# Patient Record
Sex: Female | Born: 1960 | Race: Black or African American | Hispanic: No | Smoking: Current every day smoker
Health system: Southern US, Community
[De-identification: ages and names within clinical notes are randomized; demographics above are authoritative.]

## PROBLEM LIST (undated history)

## (undated) DIAGNOSIS — N189 Chronic kidney disease, unspecified: Secondary | ICD-10-CM

## (undated) DIAGNOSIS — E7800 Pure hypercholesterolemia, unspecified: Secondary | ICD-10-CM

## (undated) DIAGNOSIS — G4733 Obstructive sleep apnea (adult) (pediatric): Secondary | ICD-10-CM

## (undated) DIAGNOSIS — M329 Systemic lupus erythematosus, unspecified: Secondary | ICD-10-CM

## (undated) DIAGNOSIS — J45909 Unspecified asthma, uncomplicated: Secondary | ICD-10-CM

## (undated) DIAGNOSIS — E119 Type 2 diabetes mellitus without complications: Secondary | ICD-10-CM

## (undated) DIAGNOSIS — M797 Fibromyalgia: Secondary | ICD-10-CM

## (undated) DIAGNOSIS — I10 Essential (primary) hypertension: Secondary | ICD-10-CM

## (undated) DIAGNOSIS — G894 Chronic pain syndrome: Secondary | ICD-10-CM

## (undated) DIAGNOSIS — F609 Personality disorder, unspecified: Secondary | ICD-10-CM

## (undated) DIAGNOSIS — F329 Major depressive disorder, single episode, unspecified: Secondary | ICD-10-CM

## (undated) DIAGNOSIS — H269 Unspecified cataract: Secondary | ICD-10-CM

## (undated) DIAGNOSIS — D649 Anemia, unspecified: Secondary | ICD-10-CM

## (undated) DIAGNOSIS — F32A Depression, unspecified: Secondary | ICD-10-CM

## (undated) HISTORY — PX: KNEE ARTHROSCOPY: SUR90

## (undated) HISTORY — DX: Unspecified cataract: H26.9

## (undated) HISTORY — PX: OOPHORECTOMY: SHX86

## (undated) HISTORY — PX: GALLBLADDER SURGERY: SHX652

## (undated) HISTORY — PX: SPINAL FUSION: SHX223

## (undated) HISTORY — PX: BUNIONECTOMY: SHX129

## (undated) HISTORY — PX: ABDOMINAL HYSTERECTOMY: SHX81

## (undated) HISTORY — PX: CERVICAL FUSION: SHX112

---

## 2016-05-05 ENCOUNTER — Ambulatory Visit
Admission: RE | Admit: 2016-05-05 | Discharge: 2016-05-05 | Disposition: A | Discharge status: Home / Self Care | Payer: Medicaid Other | Source: Ambulatory Visit | Attending: Anesthesiology | Admitting: Anesthesiology

## 2016-05-05 ENCOUNTER — Other Ambulatory Visit: Payer: Self-pay | Admitting: Anesthesiology

## 2016-05-05 DIAGNOSIS — M545 Low back pain: Secondary | ICD-10-CM

## 2016-06-01 ENCOUNTER — Other Ambulatory Visit: Payer: Self-pay | Admitting: Internal Medicine

## 2016-06-01 DIAGNOSIS — Z1231 Encounter for screening mammogram for malignant neoplasm of breast: Secondary | ICD-10-CM

## 2016-06-21 ENCOUNTER — Ambulatory Visit
Admission: RE | Admit: 2016-06-21 | Discharge: 2016-06-21 | Disposition: A | Discharge status: Home / Self Care | Payer: Medicare Other | Source: Ambulatory Visit | Attending: Internal Medicine | Admitting: Internal Medicine

## 2016-06-21 DIAGNOSIS — Z1231 Encounter for screening mammogram for malignant neoplasm of breast: Secondary | ICD-10-CM

## 2016-10-22 ENCOUNTER — Encounter (HOSPITAL_COMMUNITY): Payer: Self-pay

## 2016-10-22 ENCOUNTER — Emergency Department (HOSPITAL_COMMUNITY): Payer: Medicare Other

## 2016-10-22 ENCOUNTER — Inpatient Hospital Stay (HOSPITAL_COMMUNITY)
Admission: EM | Admit: 2016-10-22 | Discharge: 2016-10-24 | DRG: 202 | Disposition: A | Discharge status: Home / Self Care | Payer: Medicare Other | Attending: Internal Medicine | Admitting: Internal Medicine

## 2016-10-22 DIAGNOSIS — M329 Systemic lupus erythematosus, unspecified: Secondary | ICD-10-CM | POA: Diagnosis present

## 2016-10-22 DIAGNOSIS — I110 Hypertensive heart disease with heart failure: Secondary | ICD-10-CM | POA: Diagnosis present

## 2016-10-22 DIAGNOSIS — M797 Fibromyalgia: Secondary | ICD-10-CM | POA: Diagnosis present

## 2016-10-22 DIAGNOSIS — J45901 Unspecified asthma with (acute) exacerbation: Secondary | ICD-10-CM

## 2016-10-22 DIAGNOSIS — Z885 Allergy status to narcotic agent status: Secondary | ICD-10-CM | POA: Diagnosis not present

## 2016-10-22 DIAGNOSIS — E119 Type 2 diabetes mellitus without complications: Secondary | ICD-10-CM | POA: Diagnosis present

## 2016-10-22 DIAGNOSIS — G4733 Obstructive sleep apnea (adult) (pediatric): Secondary | ICD-10-CM | POA: Diagnosis present

## 2016-10-22 DIAGNOSIS — Z881 Allergy status to other antibiotic agents status: Secondary | ICD-10-CM | POA: Diagnosis not present

## 2016-10-22 DIAGNOSIS — F329 Major depressive disorder, single episode, unspecified: Secondary | ICD-10-CM | POA: Diagnosis present

## 2016-10-22 DIAGNOSIS — Z79899 Other long term (current) drug therapy: Secondary | ICD-10-CM | POA: Diagnosis not present

## 2016-10-22 DIAGNOSIS — E7800 Pure hypercholesterolemia, unspecified: Secondary | ICD-10-CM | POA: Diagnosis present

## 2016-10-22 DIAGNOSIS — I10 Essential (primary) hypertension: Secondary | ICD-10-CM

## 2016-10-22 DIAGNOSIS — J209 Acute bronchitis, unspecified: Secondary | ICD-10-CM | POA: Diagnosis present

## 2016-10-22 DIAGNOSIS — Z888 Allergy status to other drugs, medicaments and biological substances status: Secondary | ICD-10-CM | POA: Diagnosis not present

## 2016-10-22 DIAGNOSIS — J4541 Moderate persistent asthma with (acute) exacerbation: Secondary | ICD-10-CM | POA: Diagnosis present

## 2016-10-22 DIAGNOSIS — Z794 Long term (current) use of insulin: Secondary | ICD-10-CM

## 2016-10-22 DIAGNOSIS — J440 Chronic obstructive pulmonary disease with acute lower respiratory infection: Secondary | ICD-10-CM | POA: Diagnosis present

## 2016-10-22 DIAGNOSIS — F609 Personality disorder, unspecified: Secondary | ICD-10-CM | POA: Diagnosis present

## 2016-10-22 DIAGNOSIS — G894 Chronic pain syndrome: Secondary | ICD-10-CM | POA: Diagnosis present

## 2016-10-22 DIAGNOSIS — I509 Heart failure, unspecified: Secondary | ICD-10-CM | POA: Diagnosis present

## 2016-10-22 DIAGNOSIS — R0600 Dyspnea, unspecified: Secondary | ICD-10-CM | POA: Diagnosis not present

## 2016-10-22 DIAGNOSIS — J441 Chronic obstructive pulmonary disease with (acute) exacerbation: Secondary | ICD-10-CM

## 2016-10-22 HISTORY — DX: Unspecified asthma, uncomplicated: J45.909

## 2016-10-22 HISTORY — DX: Chronic kidney disease, unspecified: N18.9

## 2016-10-22 HISTORY — DX: Type 2 diabetes mellitus without complications: E11.9

## 2016-10-22 HISTORY — DX: Essential (primary) hypertension: I10

## 2016-10-22 HISTORY — DX: Systemic lupus erythematosus, unspecified: M32.9

## 2016-10-22 HISTORY — DX: Chronic pain syndrome: G89.4

## 2016-10-22 HISTORY — DX: Pure hypercholesterolemia, unspecified: E78.00

## 2016-10-22 HISTORY — DX: Fibromyalgia: M79.7

## 2016-10-22 HISTORY — DX: Personality disorder, unspecified: F60.9

## 2016-10-22 HISTORY — DX: Obstructive sleep apnea (adult) (pediatric): G47.33

## 2016-10-22 HISTORY — DX: Depression, unspecified: F32.A

## 2016-10-22 HISTORY — DX: Major depressive disorder, single episode, unspecified: F32.9

## 2016-10-22 HISTORY — DX: Anemia, unspecified: D64.9

## 2016-10-22 LAB — BASIC METABOLIC PANEL
Anion gap: 7 (ref 5–15)
BUN: 17 mg/dL (ref 6–20)
CHLORIDE: 104 mmol/L (ref 101–111)
CO2: 26 mmol/L (ref 22–32)
Calcium: 9.3 mg/dL (ref 8.9–10.3)
Creatinine, Ser: 1.14 mg/dL — ABNORMAL HIGH (ref 0.44–1.00)
GFR calc non Af Amer: 52 mL/min — ABNORMAL LOW (ref >60)
Glucose, Bld: 153 mg/dL — ABNORMAL HIGH (ref 65–99)
POTASSIUM: 4.6 mmol/L (ref 3.5–5.1)
Sodium: 137 mmol/L (ref 135–145)

## 2016-10-22 LAB — INFLUENZA PANEL BY PCR (TYPE A & B)
Influenza A By PCR: NEGATIVE
Influenza B By PCR: NEGATIVE

## 2016-10-22 LAB — BRAIN NATRIURETIC PEPTIDE
B Natriuretic Peptide: 253.6 pg/mL — ABNORMAL HIGH (ref 0.0–100.0)
B Natriuretic Peptide: 193 pg/mL — ABNORMAL HIGH (ref 0.0–100.0)

## 2016-10-22 LAB — STREP PNEUMONIAE URINARY ANTIGEN: STREP PNEUMO URINARY ANTIGEN: NEGATIVE

## 2016-10-22 LAB — CBC WITH DIFFERENTIAL/PLATELET
BASOS ABS: 0 10*3/uL (ref 0.0–0.1)
Basophils Relative: 0 %
Eosinophils Absolute: 0.1 10*3/uL (ref 0.0–0.7)
Eosinophils Relative: 1 %
HEMATOCRIT: 32.9 % — AB (ref 36.0–46.0)
Hemoglobin: 10.8 g/dL — ABNORMAL LOW (ref 12.0–15.0)
LYMPHS ABS: 1.8 10*3/uL (ref 0.7–4.0)
LYMPHS PCT: 14 %
MCH: 29.8 pg (ref 26.0–34.0)
MCHC: 32.8 g/dL (ref 30.0–36.0)
MCV: 90.9 fL (ref 78.0–100.0)
Monocytes Absolute: 0.6 10*3/uL (ref 0.1–1.0)
Monocytes Relative: 5 %
NEUTROS ABS: 10.3 10*3/uL — AB (ref 1.7–7.7)
Neutrophils Relative %: 80 %
Platelets: 271 10*3/uL (ref 150–400)
RBC: 3.62 MIL/uL — AB (ref 3.87–5.11)
RDW: 14.7 % (ref 11.5–15.5)
WBC: 12.8 10*3/uL — ABNORMAL HIGH (ref 4.0–10.5)

## 2016-10-22 LAB — GLUCOSE, CAPILLARY
GLUCOSE-CAPILLARY: 223 mg/dL — AB (ref 65–99)
GLUCOSE-CAPILLARY: 193 mg/dL — AB (ref 65–99)

## 2016-10-22 LAB — URINALYSIS, ROUTINE W REFLEX MICROSCOPIC
Bilirubin Urine: NEGATIVE
GLUCOSE, UA: NEGATIVE mg/dL
HGB URINE DIPSTICK: NEGATIVE
Ketones, ur: NEGATIVE mg/dL
LEUKOCYTES UA: NEGATIVE
Nitrite: NEGATIVE
PH: 5 (ref 5.0–8.0)
PROTEIN: NEGATIVE mg/dL
SPECIFIC GRAVITY, URINE: 1.01 (ref 1.005–1.030)

## 2016-10-22 LAB — I-STAT TROPONIN, ED: Troponin i, poc: 0.01 ng/mL (ref 0.00–0.08)

## 2016-10-22 LAB — PHOSPHORUS: PHOSPHORUS: 3.6 mg/dL (ref 2.5–4.6)

## 2016-10-22 LAB — TROPONIN I: Troponin I: 0.03 ng/mL (ref <0.03)

## 2016-10-22 LAB — MAGNESIUM: MAGNESIUM: 1.7 mg/dL (ref 1.7–2.4)

## 2016-10-22 LAB — TSH: TSH: 0.65 u[IU]/mL (ref 0.350–4.500)

## 2016-10-22 MED ORDER — IPRATROPIUM-ALBUTEROL 0.5-2.5 (3) MG/3ML IN SOLN
3.0000 mL | RESPIRATORY_TRACT | Status: AC
  Administered 2016-10-22 (×3): 3 mL via RESPIRATORY_TRACT
  Filled 2016-10-22: qty 9

## 2016-10-22 MED ORDER — ALBUTEROL (5 MG/ML) CONTINUOUS INHALATION SOLN
5.0000 mg/h | INHALATION_SOLUTION | RESPIRATORY_TRACT | Status: DC
  Administered 2016-10-22: 5 mg/h via RESPIRATORY_TRACT
  Filled 2016-10-22: qty 20

## 2016-10-22 MED ORDER — SERTRALINE HCL 100 MG PO TABS
200.0000 mg | ORAL_TABLET | Freq: Every day | ORAL | Status: DC
  Administered 2016-10-22 – 2016-10-24 (×3): 200 mg via ORAL
  Filled 2016-10-22 (×3): qty 2

## 2016-10-22 MED ORDER — FUROSEMIDE 40 MG PO TABS
80.0000 mg | ORAL_TABLET | Freq: Every day | ORAL | Status: DC
  Administered 2016-10-23 – 2016-10-24 (×2): 80 mg via ORAL
  Filled 2016-10-22 (×2): qty 2

## 2016-10-22 MED ORDER — ONDANSETRON HCL 4 MG PO TABS
4.0000 mg | ORAL_TABLET | Freq: Three times a day (TID) | ORAL | Status: DC | PRN
  Administered 2016-10-23: 4 mg via ORAL
  Filled 2016-10-22: qty 1

## 2016-10-22 MED ORDER — PREDNISONE 20 MG PO TABS
40.0000 mg | ORAL_TABLET | Freq: Once | ORAL | Status: AC
  Administered 2016-10-22: 40 mg via ORAL
  Filled 2016-10-22: qty 2

## 2016-10-22 MED ORDER — SALINE SPRAY 0.65 % NA SOLN
2.0000 | Freq: Two times a day (BID) | NASAL | Status: DC
  Administered 2016-10-22 – 2016-10-24 (×4): 2 via NASAL
  Filled 2016-10-22: qty 44

## 2016-10-22 MED ORDER — SODIUM CHLORIDE 0.45 % IV SOLN
INTRAVENOUS | Status: DC
  Administered 2016-10-22: 22:00:00 via INTRAVENOUS

## 2016-10-22 MED ORDER — ALBUTEROL SULFATE (2.5 MG/3ML) 0.083% IN NEBU
2.5000 mg | INHALATION_SOLUTION | RESPIRATORY_TRACT | Status: DC | PRN
  Administered 2016-10-23: 2.5 mg via RESPIRATORY_TRACT
  Filled 2016-10-22: qty 3

## 2016-10-22 MED ORDER — BUSPIRONE HCL 5 MG PO TABS
15.0000 mg | ORAL_TABLET | Freq: Three times a day (TID) | ORAL | Status: DC
  Administered 2016-10-22 – 2016-10-24 (×5): 15 mg via ORAL
  Filled 2016-10-22 (×5): qty 3

## 2016-10-22 MED ORDER — ARIPIPRAZOLE 10 MG PO TABS
20.0000 mg | ORAL_TABLET | Freq: Every day | ORAL | Status: DC
  Administered 2016-10-22 – 2016-10-24 (×3): 20 mg via ORAL
  Filled 2016-10-22 (×3): qty 2

## 2016-10-22 MED ORDER — SIMVASTATIN 40 MG PO TABS
40.0000 mg | ORAL_TABLET | Freq: Every day | ORAL | Status: DC
  Administered 2016-10-22 – 2016-10-24 (×3): 40 mg via ORAL
  Filled 2016-10-22 (×3): qty 1

## 2016-10-22 MED ORDER — TAB-A-VITE/IRON PO TABS
1.0000 | ORAL_TABLET | Freq: Every day | ORAL | Status: DC
  Administered 2016-10-22 – 2016-10-24 (×3): 1 via ORAL
  Filled 2016-10-22 (×3): qty 1

## 2016-10-22 MED ORDER — PANTOPRAZOLE SODIUM 40 MG PO TBEC
40.0000 mg | DELAYED_RELEASE_TABLET | Freq: Every day | ORAL | Status: DC
  Administered 2016-10-22 – 2016-10-24 (×3): 40 mg via ORAL
  Filled 2016-10-22 (×3): qty 1

## 2016-10-22 MED ORDER — SODIUM CHLORIDE 0.9 % IV SOLN: 250.0000 mL | INTRAVENOUS | Status: DC | PRN

## 2016-10-22 MED ORDER — METHYLPREDNISOLONE SODIUM SUCC 125 MG IJ SOLR
125.0000 mg | Freq: Four times a day (QID) | INTRAMUSCULAR | Status: DC
  Administered 2016-10-22 – 2016-10-23 (×2): 125 mg via INTRAVENOUS
  Filled 2016-10-22 (×2): qty 2

## 2016-10-22 MED ORDER — MORPHINE-NALTREXONE 50-2 MG PO CPCR: 1.0000 | ORAL_CAPSULE | Freq: Every day | ORAL | Status: DC

## 2016-10-22 MED ORDER — SODIUM CHLORIDE 0.9% FLUSH: 3.0000 mL | INTRAVENOUS | Status: DC | PRN

## 2016-10-22 MED ORDER — INSULIN ASPART 100 UNIT/ML ~~LOC~~ SOLN
0.0000 [IU] | Freq: Three times a day (TID) | SUBCUTANEOUS | Status: DC
  Administered 2016-10-23: 8 [IU] via SUBCUTANEOUS
  Administered 2016-10-23 – 2016-10-24 (×3): 3 [IU] via SUBCUTANEOUS
  Administered 2016-10-24: 8 [IU] via SUBCUTANEOUS

## 2016-10-22 MED ORDER — MONTELUKAST SODIUM 10 MG PO TABS
10.0000 mg | ORAL_TABLET | Freq: Every day | ORAL | Status: DC
  Administered 2016-10-22 – 2016-10-24 (×3): 10 mg via ORAL
  Filled 2016-10-22 (×3): qty 1

## 2016-10-22 MED ORDER — INSULIN ASPART 100 UNIT/ML ~~LOC~~ SOLN
4.0000 [IU] | Freq: Three times a day (TID) | SUBCUTANEOUS | Status: DC
  Administered 2016-10-23 – 2016-10-24 (×5): 4 [IU] via SUBCUTANEOUS

## 2016-10-22 MED ORDER — LEVOFLOXACIN IN D5W 750 MG/150ML IV SOLN
750.0000 mg | INTRAVENOUS | Status: DC
  Administered 2016-10-22: 750 mg via INTRAVENOUS
  Filled 2016-10-22: qty 150

## 2016-10-22 MED ORDER — METFORMIN HCL 500 MG PO TABS: 500.0000 mg | ORAL_TABLET | Freq: Three times a day (TID) | ORAL | Status: DC

## 2016-10-22 MED ORDER — MIRTAZAPINE 15 MG PO TABS
45.0000 mg | ORAL_TABLET | Freq: Every day | ORAL | Status: DC
  Administered 2016-10-22 – 2016-10-23 (×2): 45 mg via ORAL
  Filled 2016-10-22 (×2): qty 3

## 2016-10-22 MED ORDER — INSULIN ASPART 100 UNIT/ML ~~LOC~~ SOLN
0.0000 [IU] | Freq: Every day | SUBCUTANEOUS | Status: DC
  Administered 2016-10-23: 2 [IU] via SUBCUTANEOUS

## 2016-10-22 MED ORDER — MORPHINE-NALTREXONE 50-2 MG PO CPCR
1.0000 | ORAL_CAPSULE | Freq: Every day | ORAL | Status: DC
  Administered 2016-10-22 – 2016-10-24 (×3): 1 via ORAL

## 2016-10-22 MED ORDER — BISACODYL 10 MG RE SUPP: 10.0000 mg | Freq: Every day | RECTAL | Status: DC | PRN

## 2016-10-22 MED ORDER — HEPARIN SODIUM (PORCINE) 5000 UNIT/ML IJ SOLN
5000.0000 [IU] | Freq: Three times a day (TID) | INTRAMUSCULAR | Status: DC
  Administered 2016-10-22 – 2016-10-24 (×5): 5000 [IU] via SUBCUTANEOUS
  Filled 2016-10-22 (×5): qty 1

## 2016-10-22 MED ORDER — SODIUM CHLORIDE 0.9% FLUSH
3.0000 mL | Freq: Two times a day (BID) | INTRAVENOUS | Status: DC
  Administered 2016-10-22 – 2016-10-24 (×4): 3 mL via INTRAVENOUS

## 2016-10-22 MED ORDER — ALBUTEROL SULFATE HFA 108 (90 BASE) MCG/ACT IN AERS: 2.0000 | INHALATION_SPRAY | RESPIRATORY_TRACT | Status: DC | PRN

## 2016-10-22 MED ORDER — GABAPENTIN 800 MG PO TABS
800.0000 mg | ORAL_TABLET | Freq: Four times a day (QID) | ORAL | Status: DC
  Filled 2016-10-22: qty 1

## 2016-10-22 MED ORDER — LABETALOL HCL 200 MG PO TABS
300.0000 mg | ORAL_TABLET | Freq: Two times a day (BID) | ORAL | Status: DC
  Administered 2016-10-22 – 2016-10-24 (×4): 300 mg via ORAL
  Filled 2016-10-22 (×4): qty 1

## 2016-10-22 MED ORDER — IRBESARTAN 75 MG PO TABS
37.5000 mg | ORAL_TABLET | Freq: Every day | ORAL | Status: DC
  Administered 2016-10-22 – 2016-10-24 (×2): 37.5 mg via ORAL
  Filled 2016-10-22 (×2): qty 1

## 2016-10-22 MED ORDER — GABAPENTIN 400 MG PO CAPS
800.0000 mg | ORAL_CAPSULE | Freq: Four times a day (QID) | ORAL | Status: DC
  Administered 2016-10-22 – 2016-10-24 (×7): 800 mg via ORAL
  Filled 2016-10-22 (×7): qty 2

## 2016-10-22 NOTE — ED Provider Notes (Signed)
[facility]-EMERGENCY DEPT Provider Note   CSN: [id number] Arrival date & time: 10/22/16  0920     History   Chief Complaint Chief Complaint  Patient presents with  . URI  . Wheezing    HPI Lauren Jenkins is a 56 y.o. female.  The history is provided by the patient.  Shortness of Breath  This is a recurrent problem. The average episode lasts 1 week. The problem has been gradually worsening. Associated symptoms include coryza, cough, sputum production and chest pain. Pertinent negatives include no fever and no leg swelling. The problem's precipitants include weather/humidity and smoke. Risk factors include smoking. She has tried beta-agonist inhalers for the symptoms. The treatment provided mild relief. Associated medical issues include asthma. Associated medical issues do not include PE, past MI or DVT.  Chest Pain   This is a recurrent problem. Episode onset: 1 week. Episode frequency: intermittent initially; constant for 2-3 days. The problem has not changed since onset.The pain is present in the substernal region. The pain is mild. Quality: tightness; "can't get air in" The pain does not radiate. Associated symptoms include cough, shortness of breath and sputum production. Pertinent negatives include no fever. Risk factors include smoking/tobacco exposure and sedentary lifestyle.  Her past medical history is significant for diabetes, hyperlipidemia and hypertension.  Pertinent negatives for past medical history include no DVT, no MI and no PE.  Procedure history is positive for cardiac catheterization (3-4  yrs ago; no stents placed ) and stress thallium (2016; yrs after the cath).   Endorses h/o tobacco smoking for 15 yrs.    Past Medical History:  Diagnosis Date  . Asthma   . Asthma   . Chronic anemia   . Chronic pain syndrome 10/22/2016  . CKD (chronic kidney disease)   . Depression   . DM2 (diabetes mellitus, type 2) (HCC)   . Fibromyalgia 10/22/2016  . Hypercholesterolemia    . Hypertension   . Hypertension   . Lupus   . Obstructive sleep apnea   . Personality disorder     Patient Active Problem List   Diagnosis Date Noted  . Obstructive sleep apnea 10/22/2016  . DM2 (diabetes mellitus, type 2) (HCC) 10/22/2016  . Hypertension 10/22/2016  . Acute asthma exacerbation 10/22/2016  . Acute bronchitis 10/22/2016    History reviewed. No pertinent surgical history.  OB History    No data available       Home Medications    Prior to Admission medications   Medication Sig Start Date End Date Taking? Authorizing Provider  albuterol (PROVENTIL HFA;VENTOLIN HFA) 108 (90 Base) MCG/ACT inhaler Inhale 2 puffs into the lungs every 4 (four) hours as needed for wheezing or shortness of breath.   Yes Historical Provider, MD  ARIPiprazole (ABILIFY) 20 MG tablet Take 20 mg by mouth daily.   Yes Historical Provider, MD  baclofen (LIORESAL) 10 MG tablet Take 10 mg by mouth 2 (two) times daily as needed for muscle spasms.   Yes Historical Provider, MD  busPIRone (BUSPAR) 15 MG tablet Take 15 mg by mouth 3 (three) times daily.   Yes Historical Provider, MD  EMBEDA 50-2 MG CPCR Take 1 capsule by mouth daily.  10/06/16  Yes Historical Provider, MD  furosemide (LASIX) 40 MG tablet Take 40-80 mg by mouth daily.   Yes Historical Provider, MD  gabapentin (NEURONTIN) 800 MG tablet Take 800 mg by mouth 4 (four) times daily.   Yes Historical Provider, MD  insulin aspart (NOVOLOG FLEXPEN) 100 UNIT/ML FlexPen  Inject 15 Units into the skin 3 (three) times daily with meals.   Yes Historical Provider, MD  labetalol (NORMODYNE) 300 MG tablet Take 300 mg by mouth 2 (two) times daily.   Yes Historical Provider, MD  metFORMIN (GLUCOPHAGE) 500 MG tablet Take 500 mg by mouth 3 (three) times daily.   Yes Historical Provider, MD  methocarbamol (ROBAXIN) 500 MG tablet Take 500 mg by mouth 3 (three) times daily.  10/02/16  Yes Historical Provider, MD  mirtazapine (REMERON) 45 MG tablet Take 45 mg  by mouth at bedtime.   Yes Historical Provider, MD  montelukast (SINGULAIR) 10 MG tablet Take 10 mg by mouth daily.   Yes Historical Provider, MD  Multiple Vitamins-Minerals (CENTRAVITES 50 PLUS PO) Take 1 tablet by mouth daily.   Yes Historical Provider, MD  ondansetron (ZOFRAN) 4 MG tablet Take 4 mg by mouth every 8 (eight) hours as needed for nausea or vomiting.   Yes Historical Provider, MD  OXAYDO 5 MG TABA take 2 tablets by mouth four times a day as needed for pain 10/02/16  Yes Historical Provider, MD  pantoprazole (PROTONIX) 40 MG tablet Take 40 mg by mouth daily.   Yes Historical Provider, MD  sertraline (ZOLOFT) 100 MG tablet Take 200 mg by mouth daily.   Yes Historical Provider, MD  simvastatin (ZOCOR) 40 MG tablet Take 40 mg by mouth daily.   Yes Historical Provider, MD  sodium chloride (OCEAN) 0.65 % SOLN nasal spray Place 2 sprays into both nostrils 2 (two) times daily.   Yes Historical Provider, MD  telmisartan (MICARDIS) 40 MG tablet Take 80 mg by mouth daily.   Yes Historical Provider, MD    Family History History reviewed. No pertinent family history.  Social History Social History  Substance Use Topics  . Smoking status: Never Smoker  . Smokeless tobacco: Never Used  . Alcohol use No     Allergies   Codeine sulfate; Ancef [cefazolin]; Apap [acetaminophen]; Elavil [amitriptyline]; Haldol [haloperidol]; Lyrica [pregabalin]; Prednisone; Procardia [nifedipine]; Rocephin [ceftriaxone]; Clindamycin/lincomycin; Lipitor [atorvastatin]; Metoprolol tartrate; and Zithromax [azithromycin]   Review of Systems Review of Systems  Constitutional: Negative for fever.  Respiratory: Positive for cough, sputum production and shortness of breath.   Cardiovascular: Positive for chest pain. Negative for leg swelling.  Ten systems are reviewed and are negative for acute change except as noted in the HPI    Physical Exam Updated Vital Signs BP 133/76 (BP Location: Left Arm)   Pulse  91   Temp 99 F (37.2 C) (Oral)   Resp 19   Ht 5' 0.25" (1.53 m)   Wt 249 lb (112.9 kg)   SpO2 100%   BMI 48.23 kg/m   Physical Exam  Constitutional: She is oriented to person, place, and time. She appears well-developed and well-nourished. No distress.  HENT:  Head: Normocephalic and atraumatic.  Nose: Nose normal.  Eyes: Conjunctivae and EOM are normal. Pupils are equal, round, and reactive to light. Right eye exhibits no discharge. Left eye exhibits no discharge. No scleral icterus.  Neck: Normal range of motion. Neck supple.  Cardiovascular: Normal rate and regular rhythm.  Exam reveals no gallop and no friction rub.   No murmur heard. Pulmonary/Chest: Effort normal. No stridor. No respiratory distress. She has wheezes.  Poor air flow throughout.   Abdominal: Soft. She exhibits no distension. There is no tenderness.  Musculoskeletal: She exhibits no edema or tenderness.  Neurological: She is alert and oriented to person, place, and time.  Skin: Skin is warm and dry. No rash noted. She is not diaphoretic. No erythema.  Psychiatric: She has a normal mood and affect.  Vitals reviewed.    ED Treatments / Results  Labs (all labs ordered are listed, but only abnormal results are displayed) Labs Reviewed  CBC WITH DIFFERENTIAL/PLATELET - Abnormal; Notable for the following:       Result Value   WBC 12.8 (*)    RBC 3.62 (*)    Hemoglobin 10.8 (*)    HCT 32.9 (*)    Neutro Abs 10.3 (*)    All other components within normal limits  BRAIN NATRIURETIC PEPTIDE - Abnormal; Notable for the following:    B Natriuretic Peptide 253.6 (*)    All other components within normal limits  BASIC METABOLIC PANEL - Abnormal; Notable for the following:    Glucose, Bld 153 (*)    Creatinine, Ser 1.14 (*)    GFR calc non Af Amer 52 (*)    All other components within normal limits  I-STAT TROPOININ, ED    EKG  EKG Interpretation None       Radiology Dg Chest 2 View  Result Date:  10/22/2016 CLINICAL DATA:  Cough, chest pain. EXAM: CHEST  2 VIEW COMPARISON:  None. FINDINGS: Mild cardiomegaly is noted with central pulmonary vascular congestion. No pneumothorax or pleural effusion is noted. No consolidative process is noted in the lungs. Bony thorax is unremarkable. IMPRESSION: Mild cardiomegaly with central pulmonary vascular congestion. Electronically Signed   By: Lupita RaiderJames  Green Jr, M.D.   On: 10/22/2016 10:33    Procedures Procedures (including critical care time)  Medications Ordered in ED Medications  albuterol (PROVENTIL,VENTOLIN) solution continuous neb (5 mg/hr Nebulization New Bag/Given 10/22/16 1330)  albuterol (PROVENTIL) (2.5 MG/3ML) 0.083% nebulizer solution 2.5 mg (not administered)  methylPREDNISolone sodium succinate (SOLU-MEDROL) 125 mg/2 mL injection 125 mg (not administered)  ipratropium-albuterol (DUONEB) 0.5-2.5 (3) MG/3ML nebulizer solution 3 mL (3 mLs Nebulization Given 10/22/16 1140)  predniSONE (DELTASONE) tablet 40 mg (40 mg Oral Given 10/22/16 1116)     Initial Impression / Assessment and Plan / ED Course  I have reviewed the triage vital signs and the nursing notes.  Pertinent labs & imaging results that were available during my care of the patient were reviewed by me and considered in my medical decision making (see chart for details).     COPD/asthma exacerbation. Chest x-ray without evidence of pneumonia. Improvement after DuoNeb's however she should symptoms recurred requiring continuous albuterol nebulizer. Low suspicion for pulmonary embolism. No evidence of volume overload to suggest CHF exacerbation. Admitted to hospitalist for continued management.  Final Clinical Impressions(s) / ED Diagnoses   Final diagnoses:  COPD exacerbation (HCC)  Moderate persistent asthma with exacerbation      Nira ConnPedro Eduardo Raeghan Demeter, MD 10/22/16 1712

## 2016-10-22 NOTE — ED Notes (Signed)
Bed: [id number] Expected date: 10/22/16 Expected time: 9:00 AM Means of arrival: Ambulance Comments: Wheezing, SHOB, breathing tx in progress

## 2016-10-22 NOTE — ED Notes (Signed)
I find her to have audible wheezes; and a bit incresaing shortness of breath as compared with my last assessment. Dr. Eudelia Bunchardama notified--orders rec'd.

## 2016-10-22 NOTE — H&P (Signed)
History and Physical    Lauren Jenkins WUJ:[id number] DOB: July 30, 1961 DOA: 10/22/2016  Referring MD/NP/PA: EDP PCP: No primary care provider on file. Trula Slade, MD Outpatient Specialists:  Patient coming from: Home  Chief Complaint: SOB  HPI: Lauren Jenkins is a 56 y.o. female with medical history significant for and not limited to Hypertension,  OSA on CPAP, and Bronchial Asthma, not steroid dependent ( saids she tends to get "psychotic after being on prednisone for long time, but has been ok with IV steroids previoulsy), s/p endotracheal intubation and mechanical ventilation once several years ago..  She presents with few days history of progressively worsening cough, and chest tightness with coughing as well as worsening shortness of breath  She notes that her symptoms started 2 weeks ago with sorethroat, clear rhinorrhea 2 weeks ago which progressed to with yellowish green-sputum associated with low grade fever and chills, unresponsive to robitussin. She had made appointment to see her pcp, Dr Nehemiah Settle tomorrow but she could not wait due to worsening cough with chest tightness, and difficulty completing simple sentences today.   ED Course: At the ED patient attempt of 48F with a pulse of 91 Minute and she had bilateral wheezes. She was treated with nebulizations and improved however she subsequently worsened again with wheezing and difficulty breathing. Labs indicated mild leukocytosis of 12.8 K.Marland Kitchen EKG was negative for acute ischemic changes and x-ray of the chest was negative for any acute airspace disease.  She is admitted for further treatment of acute bronchial asthma exacerbation secondary to acute bronchitis  Review of Systems: As per HPI otherwise 10 point review of systems negative.   Past Medical History:  Diagnosis Date  . Asthma   . Asthma   . Chronic anemia   . Chronic pain syndrome 10/22/2016  . CKD (chronic kidney disease)   . Depression   . DM2 (diabetes mellitus,  type 2) (HCC)   . Fibromyalgia 10/22/2016  . Hypercholesterolemia   . Hypertension   . Hypertension   . Lupus   . Obstructive sleep apnea   . Personality disorder     History reviewed. No pertinent surgical history.   reports that she has never smoked. She has never used smokeless tobacco. She reports that she does not drink alcohol. Her drug history is not on file.  Allergies  Allergen Reactions  . Codeine Sulfate Shortness Of Breath  . Ancef [Cefazolin] Other (See Comments)    Intolerance   . Apap [Acetaminophen] Other (See Comments)    Intolerance    . Elavil [Amitriptyline] Other (See Comments)    Hallucinations  . Haldol [Haloperidol] Other (See Comments)    Intolerance   . Lyrica [Pregabalin] Other (See Comments)    agitation   . Prednisone Other (See Comments)    Agitation   . Procardia [Nifedipine] Other (See Comments)    insensitivity  . Rocephin [Ceftriaxone] Swelling    angioedema and facial swelling  . Clindamycin/Lincomycin Rash  . Lipitor [Atorvastatin] Rash  . Metoprolol Tartrate Rash  . Zithromax [Azithromycin] Rash    History reviewed. No pertinent family history. Denies known family history of heart disease Prior to Admission medications   Medication Sig Start Date End Date Taking? Authorizing Provider  albuterol (PROVENTIL HFA;VENTOLIN HFA) 108 (90 Base) MCG/ACT inhaler Inhale 2 puffs into the lungs every 4 (four) hours as needed for wheezing or shortness of breath.   Yes Historical Provider, MD  ARIPiprazole (ABILIFY) 20 MG tablet Take 20 mg by mouth daily.  Yes Historical Provider, MD  baclofen (LIORESAL) 10 MG tablet Take 10 mg by mouth 2 (two) times daily as needed for muscle spasms.   Yes Historical Provider, MD  busPIRone (BUSPAR) 15 MG tablet Take 15 mg by mouth 3 (three) times daily.   Yes Historical Provider, MD  EMBEDA 50-2 MG CPCR Take 1 capsule by mouth daily.  10/06/16  Yes Historical Provider, MD  furosemide (LASIX) 40 MG tablet Take  40-80 mg by mouth daily.   Yes Historical Provider, MD  gabapentin (NEURONTIN) 800 MG tablet Take 800 mg by mouth 4 (four) times daily.   Yes Historical Provider, MD  insulin aspart (NOVOLOG FLEXPEN) 100 UNIT/ML FlexPen Inject 15 Units into the skin 3 (three) times daily with meals.   Yes Historical Provider, MD  labetalol (NORMODYNE) 300 MG tablet Take 300 mg by mouth 2 (two) times daily.   Yes Historical Provider, MD  metFORMIN (GLUCOPHAGE) 500 MG tablet Take 500 mg by mouth 3 (three) times daily.   Yes Historical Provider, MD  methocarbamol (ROBAXIN) 500 MG tablet Take 500 mg by mouth 3 (three) times daily.  10/02/16  Yes Historical Provider, MD  mirtazapine (REMERON) 45 MG tablet Take 45 mg by mouth at bedtime.   Yes Historical Provider, MD  montelukast (SINGULAIR) 10 MG tablet Take 10 mg by mouth daily.   Yes Historical Provider, MD  Multiple Vitamins-Minerals (CENTRAVITES 50 PLUS PO) Take 1 tablet by mouth daily.   Yes Historical Provider, MD  ondansetron (ZOFRAN) 4 MG tablet Take 4 mg by mouth every 8 (eight) hours as needed for nausea or vomiting.   Yes Historical Provider, MD  OXAYDO 5 MG TABA take 2 tablets by mouth four times a day as needed for pain 10/02/16  Yes Historical Provider, MD  pantoprazole (PROTONIX) 40 MG tablet Take 40 mg by mouth daily.   Yes Historical Provider, MD  sertraline (ZOLOFT) 100 MG tablet Take 200 mg by mouth daily.   Yes Historical Provider, MD  simvastatin (ZOCOR) 40 MG tablet Take 40 mg by mouth daily.   Yes Historical Provider, MD  sodium chloride (OCEAN) 0.65 % SOLN nasal spray Place 2 sprays into both nostrils 2 (two) times daily.   Yes Historical Provider, MD  telmisartan (MICARDIS) 40 MG tablet Take 80 mg by mouth daily.   Yes Historical Provider, MD    Physical Exam: Vitals:   10/22/16 1058 10/22/16 1100 10/22/16 1109 10/22/16 1258  BP:    142/74  Pulse:  91  99  Resp:  16  21  Temp:      TempSrc:      SpO2: 95% 96% 97% 95%  Weight:        Height:          Constitutional: NAD, calm, comfortable Vitals:   10/22/16 1058 10/22/16 1100 10/22/16 1109 10/22/16 1258  BP:    142/74  Pulse:  91  99  Resp:  16  21  Temp:      TempSrc:      SpO2: 95% 96% 97% 95%  Weight:      Height:       Eyes: PERRL, mild scleral pallor(+) ENMT: mildly dry mucous membranes. Posterior pharynx clear of any exudate or lesions.Normal dentition.  Neck: normal, supple, no masses, no thyromegaly Respiratory: Bilateral diffuse wheezing. Normal respiratory effort. No accessory muscle use.  Cardiovascular: Regular rate and rhythm, no murmurs / rubs / gallops. No extremity edema. 2+ pedal pulses. No carotid bruits.  Abdomen:  no tenderness, no masses palpated. No hepatosplenomegaly. Bowel sounds positive.  Musculoskeletal: no clubbing / cyanosis. No joint deformity upper and lower extremities. Good ROM, no contractures. Normal muscle tone.  Skin: no rashes, lesions, ulcers. No induration Neurologic: CN 2-12 grossly intact. Sensation intact, DTR normal. Strength 5/5 in all 4.  Psychiatric: Normal judgment and insight. Alert and oriented x 3. Normal mood.     Labs on Admission: I have personally reviewed following labs and imaging studies  CBC:  Recent Labs Lab 10/22/16 1102  WBC 12.8*  NEUTROABS 10.3*  HGB 10.8*  HCT 32.9*  MCV 90.9  PLT 271   Basic Metabolic Panel:  Recent Labs Lab 10/22/16 1102  NA 137  K 4.6  CL 104  CO2 26  GLUCOSE 153*  BUN 17  CREATININE 1.14*  CALCIUM 9.3   GFR: Estimated Creatinine Clearance: 62.6 mL/min (by C-G formula based on SCr of 1.14 mg/dL (H)). Liver Function Tests: No results for input(s): AST, ALT, ALKPHOS, BILITOT, PROT, ALBUMIN in the last 168 hours. No results for input(s): LIPASE, AMYLASE in the last 168 hours. No results for input(s): AMMONIA in the last 168 hours. Coagulation Profile: No results for input(s): INR, PROTIME in the last 168 hours. Cardiac Enzymes: No results for  input(s): CKTOTAL, CKMB, CKMBINDEX, TROPONINI in the last 168 hours. BNP (last 3 results) No results for input(s): PROBNP in the last 8760 hours. HbA1C: No results for input(s): HGBA1C in the last 72 hours. CBG: No results for input(s): GLUCAP in the last 168 hours. Lipid Profile: No results for input(s): CHOL, HDL, LDLCALC, TRIG, CHOLHDL, LDLDIRECT in the last 72 hours. Thyroid Function Tests: No results for input(s): TSH, T4TOTAL, FREET4, T3FREE, THYROIDAB in the last 72 hours. Anemia Panel: No results for input(s): VITAMINB12, FOLATE, FERRITIN, TIBC, IRON, RETICCTPCT in the last 72 hours. Urine analysis: No results found for: COLORURINE, APPEARANCEUR, LABSPEC, PHURINE, GLUCOSEU, HGBUR, BILIRUBINUR, KETONESUR, PROTEINUR, UROBILINOGEN, NITRITE, LEUKOCYTESUR Sepsis Labs: @LABRCNTIP (procalcitonin:4,lacticidven:4) )No results found for this or any previous visit (from the past 240 hour(s)).   Radiological Exams on Admission: Dg Chest 2 View  Result Date: 10/22/2016 CLINICAL DATA:  Cough, chest pain. EXAM: CHEST  2 VIEW COMPARISON:  None. FINDINGS: Mild cardiomegaly is noted with central pulmonary vascular congestion. No pneumothorax or pleural effusion is noted. No consolidative process is noted in the lungs. Bony thorax is unremarkable. IMPRESSION: Mild cardiomegaly with central pulmonary vascular congestion. Electronically Signed   By: Lupita Raider, M.D.   On: 10/22/2016 10:33    EKG: Independently reviewed.No acute ischemic changes  Assessment/Plan Principal Problem:   Acute asthma exacerbation Active Problems:   Acute bronchitis   DM2 (diabetes mellitus, type 2) (HCC)   Obstructive sleep apnea   Hypertension   #1 Acute Exacerbation of Bronchial Asthma: Secondary to acute bronchitis IV antibiotics Oxygen supplementation Cultures-blood and sputum Flu PCR Bronchodilator nebulizations prn Supportive care   #2 CHF: Trend troponin BNP Gentle diuresis with monitoring of  electrolytes 2-D echo     #3 Type 2 Diabetes Mellitus: Continue oral hypoglycemics Sliding-scale insulin CBG monitoring A1c  #4 Obstructive Sleep Apnea: Continue CPAP  DVT prophylaxis: (/Heparin) Code Status: (Full) Family Communication:  Disposition Plan: Home Consults called: Admission status: (inpatient / tele)   OSEI-BONSU,Maahi Lannan MD [facility] Pager [phone]  If 7PM-7AM, please contact night-coverage [email or web address] Password [facility]  10/22/2016, 3:34 PM

## 2016-10-22 NOTE — ED Triage Notes (Signed)
She c/o congested cough and waxing/waning wheezes x ~ 2 weeks. She arrives here in no distress and is about to finish an albuterol h.h.n. (she received a total of two h.h.n. Treatments from EMS). She is mildly short of breath and in no distress.

## 2016-10-23 ENCOUNTER — Inpatient Hospital Stay (HOSPITAL_COMMUNITY): Payer: Medicare Other

## 2016-10-23 DIAGNOSIS — J209 Acute bronchitis, unspecified: Principal | ICD-10-CM

## 2016-10-23 DIAGNOSIS — I10 Essential (primary) hypertension: Secondary | ICD-10-CM

## 2016-10-23 DIAGNOSIS — R0600 Dyspnea, unspecified: Secondary | ICD-10-CM

## 2016-10-23 DIAGNOSIS — J45901 Unspecified asthma with (acute) exacerbation: Secondary | ICD-10-CM

## 2016-10-23 LAB — BASIC METABOLIC PANEL
Anion gap: 10 (ref 5–15)
Anion gap: 7 (ref 5–15)
BUN: 19 mg/dL (ref 6–20)
BUN: 22 mg/dL — AB (ref 6–20)
CALCIUM: 9.4 mg/dL (ref 8.9–10.3)
CHLORIDE: 100 mmol/L — AB (ref 101–111)
CO2: 24 mmol/L (ref 22–32)
CO2: 27 mmol/L (ref 22–32)
CREATININE: 0.91 mg/dL (ref 0.44–1.00)
CREATININE: 1.19 mg/dL — AB (ref 0.44–1.00)
Calcium: 9.7 mg/dL (ref 8.9–10.3)
Chloride: 103 mmol/L (ref 101–111)
GFR calc Af Amer: 58 mL/min — ABNORMAL LOW (ref >60)
GFR calc non Af Amer: 50 mL/min — ABNORMAL LOW (ref >60)
GLUCOSE: 189 mg/dL — AB (ref 65–99)
GLUCOSE: 322 mg/dL — AB (ref 65–99)
Potassium: 5.4 mmol/L — ABNORMAL HIGH (ref 3.5–5.1)
Potassium: 5.6 mmol/L — ABNORMAL HIGH (ref 3.5–5.1)
SODIUM: 134 mmol/L — AB (ref 135–145)
Sodium: 137 mmol/L (ref 135–145)

## 2016-10-23 LAB — CBC
HCT: 32.3 % — ABNORMAL LOW (ref 36.0–46.0)
Hemoglobin: 10.5 g/dL — ABNORMAL LOW (ref 12.0–15.0)
MCH: 29.1 pg (ref 26.0–34.0)
MCHC: 32.5 g/dL (ref 30.0–36.0)
MCV: 89.5 fL (ref 78.0–100.0)
PLATELETS: 294 10*3/uL (ref 150–400)
RBC: 3.61 MIL/uL — AB (ref 3.87–5.11)
RDW: 14.5 % (ref 11.5–15.5)
WBC: 11.5 10*3/uL — ABNORMAL HIGH (ref 4.0–10.5)

## 2016-10-23 LAB — GLUCOSE, CAPILLARY
GLUCOSE-CAPILLARY: 268 mg/dL — AB (ref 65–99)
Glucose-Capillary: 186 mg/dL — ABNORMAL HIGH (ref 65–99)
Glucose-Capillary: 156 mg/dL — ABNORMAL HIGH (ref 65–99)
Glucose-Capillary: 210 mg/dL — ABNORMAL HIGH (ref 65–99)

## 2016-10-23 LAB — ECHOCARDIOGRAM COMPLETE
Height: 61 in
Weight: 4045.88 [oz_av]

## 2016-10-23 LAB — TROPONIN I
Troponin I: <0.03 ng/mL
Troponin I: <0.03 ng/mL

## 2016-10-23 LAB — HEMOGLOBIN A1C
Hgb A1c MFr Bld: 7.9 % — ABNORMAL HIGH (ref 4.8–5.6)
Mean Plasma Glucose: 180 mg/dL

## 2016-10-23 MED ORDER — METHYLPREDNISOLONE SODIUM SUCC 40 MG IJ SOLR
40.0000 mg | Freq: Four times a day (QID) | INTRAMUSCULAR | Status: DC
  Administered 2016-10-23 – 2016-10-24 (×5): 40 mg via INTRAVENOUS
  Filled 2016-10-23 (×5): qty 1

## 2016-10-23 MED ORDER — GUAIFENESIN-DM 100-10 MG/5ML PO SYRP
5.0000 mL | ORAL_SOLUTION | ORAL | Status: DC | PRN
  Administered 2016-10-23 – 2016-10-24 (×7): 5 mL via ORAL
  Filled 2016-10-23 (×7): qty 10

## 2016-10-23 MED ORDER — DEXTROSE 5 % IV SOLN
500.0000 mg | Freq: Every day | INTRAVENOUS | Status: DC
  Administered 2016-10-23: 500 mg via INTRAVENOUS
  Filled 2016-10-23 (×2): qty 500

## 2016-10-23 MED ORDER — OXYCODONE HCL 5 MG PO TABS
5.0000 mg | ORAL_TABLET | Freq: Four times a day (QID) | ORAL | Status: DC | PRN
  Administered 2016-10-23 – 2016-10-24 (×4): 10 mg via ORAL
  Filled 2016-10-23 (×5): qty 2

## 2016-10-23 MED ORDER — ALBUTEROL SULFATE (2.5 MG/3ML) 0.083% IN NEBU: 2.5000 mg | INHALATION_SOLUTION | RESPIRATORY_TRACT | Status: DC

## 2016-10-23 MED ORDER — INSULIN DETEMIR 100 UNIT/ML ~~LOC~~ SOLN
10.0000 [IU] | Freq: Two times a day (BID) | SUBCUTANEOUS | Status: DC
  Administered 2016-10-23 – 2016-10-24 (×3): 10 [IU] via SUBCUTANEOUS
  Filled 2016-10-23 (×4): qty 0.1

## 2016-10-23 MED ORDER — ALBUTEROL SULFATE (2.5 MG/3ML) 0.083% IN NEBU: 2.5000 mg | INHALATION_SOLUTION | Freq: Four times a day (QID) | RESPIRATORY_TRACT | Status: DC

## 2016-10-23 MED ORDER — ALBUTEROL (5 MG/ML) CONTINUOUS INHALATION SOLN: 2.5000 mg/h | INHALATION_SOLUTION | Freq: Four times a day (QID) | RESPIRATORY_TRACT | Status: DC

## 2016-10-23 MED ORDER — ALBUTEROL SULFATE (2.5 MG/3ML) 0.083% IN NEBU
2.5000 mg | INHALATION_SOLUTION | RESPIRATORY_TRACT | Status: DC
  Administered 2016-10-23 – 2016-10-24 (×6): 2.5 mg via RESPIRATORY_TRACT
  Filled 2016-10-23 (×7): qty 3

## 2016-10-23 NOTE — Progress Notes (Addendum)
[facility] PROGRESS NOTE    Progress Note  Lauren Jenkins  ZOX:[id number] DOB: 31-Mar-1961 DOA: 10/22/2016 PCP: No primary care provider on file.     Brief Narrative:   Lauren Jenkins is an 56 y.o. female past medical history of hypertension obstructive sleep apnea asthma that has required intubation several years ago comes into the hospital progressive shortness of breath cough and chest tightness for the last  Assessment/Plan:   Acute asthma exacerbation Secondary to acute bronchitis, agree with IV antibiotics and sputum cultures and bronchodilators. Continue IV steroid.  Chronic heart failure undetermined: The echo is pending, elevation of BNP is likely reactive. Continue home dose of Lasix.  Obstructive sleep apnea Continue C-pap when necessary.  DM2 (diabetes mellitus, type 2) (HCC) Hold metformin continue sliding scale insulin, will add low-dose long-acting insulin. Hemoglobin A1c is pending.  Essential  Hypertension Continue current home regimen.  Morbid obesity: Counseling.  DVT prophylaxis: lovenox Family Communication:none Disposition Plan/Barrier to D/C: home in am Code Status:     Code Status Orders        Start     Ordered   10/22/16 1750  Full code  Continuous     10/22/16 1750    Code Status History    Date Active Date Inactive Code Status Order ID Comments User Context   This patient has a current code status but no historical code status.        IV Access:    Peripheral IV   Procedures and diagnostic studies:   Dg Chest 2 View  Result Date: 10/22/2016 CLINICAL DATA:  Cough, chest pain. EXAM: CHEST  2 VIEW COMPARISON:  None. FINDINGS: Mild cardiomegaly is noted with central pulmonary vascular congestion. No pneumothorax or pleural effusion is noted. No consolidative process is noted in the lungs. Bony thorax is unremarkable. IMPRESSION: Mild cardiomegaly with central pulmonary vascular congestion. Electronically Signed   By:  Lupita Raider, M.D.   On: 10/22/2016 10:33     Medical Consultants:    None.  Anti-Infectives:   None  Subjective:    Lauren Jenkins she relates her breathing is unchanged from yesterday.  Objective:    Vitals:   10/22/16 1758 10/22/16 2009 10/23/16 0048 10/23/16 0357  BP:  (!) 170/81  (!) 148/79  Pulse:  94 71 (!) 54  Resp:  20 18 19   Temp:  97.8 F (36.6 C)  97.4 F (36.3 C)  TempSrc:  Oral  Axillary  SpO2:  98% 94% 96%  Weight: 114.7 kg (252 lb 13.9 oz)     Height: 5\' 1"  (1.549 m)       Intake/Output Summary (Last 24 hours) at 10/23/16 0751 Last data filed at 10/23/16 0700  Gross per 24 hour  Intake              870 ml  Output             1600 ml  Net             -730 ml   Filed Weights   10/22/16 0921 10/22/16 1758  Weight: 112.9 kg (249 lb) 114.7 kg (252 lb 13.9 oz)    Exam: General exam: In no acute distress. Respiratory system: Good air movement and wheezing bilaterally. Cardiovascular system: S1 & S2 heard, RRR. Gastrointestinal system: Abdomen is nondistended, soft and nontender.  Extremities: No pedal edema. Skin: No rashes, lesions or ulcers Psychiatry: Judgement and insight appear normal. Mood & affect appropriate.    Data Reviewed:  Labs: Basic Metabolic Panel:  Recent Labs Lab 10/22/16 1102 10/22/16 1833 10/23/16 0516  NA 137  --  137  K 4.6  --  5.4*  CL 104  --  103  CO2 26  --  24  GLUCOSE 153*  --  189*  BUN 17  --  19  CREATININE 1.14*  --  0.91  CALCIUM 9.3  --  9.4  MG  --  1.7  --   PHOS  --  3.6  --    GFR Estimated Creatinine Clearance: 80.3 mL/min (by C-G formula based on SCr of 0.91 mg/dL). Liver Function Tests: No results for input(s): AST, ALT, ALKPHOS, BILITOT, PROT, ALBUMIN in the last 168 hours. No results for input(s): LIPASE, AMYLASE in the last 168 hours. No results for input(s): AMMONIA in the last 168 hours. Coagulation profile No results for input(s): INR, PROTIME in the last 168  hours.  CBC:  Recent Labs Lab 10/22/16 1102 10/23/16 0516  WBC 12.8* 11.5*  NEUTROABS 10.3*  --   HGB 10.8* 10.5*  HCT 32.9* 32.3*  MCV 90.9 89.5  PLT 271 294   Cardiac Enzymes:  Recent Labs Lab 10/22/16 1833 10/22/16 2338 10/23/16 0516  TROPONINI 0.03* <0.03 <0.03   BNP (last 3 results) No results for input(s): PROBNP in the last 8760 hours. CBG:  Recent Labs Lab 10/22/16 1757 10/22/16 2130 10/23/16 0742  GLUCAP 223* 193* 186*   D-Dimer: No results for input(s): DDIMER in the last 72 hours. Hgb A1c: No results for input(s): HGBA1C in the last 72 hours. Lipid Profile: No results for input(s): CHOL, HDL, LDLCALC, TRIG, CHOLHDL, LDLDIRECT in the last 72 hours. Thyroid function studies:  Recent Labs  10/22/16 1833  TSH 0.650   Anemia work up: No results for input(s): VITAMINB12, FOLATE, FERRITIN, TIBC, IRON, RETICCTPCT in the last 72 hours. Sepsis Labs:  Recent Labs Lab 10/22/16 1102 10/23/16 0516  WBC 12.8* 11.5*   Microbiology Recent Results (from the past 240 hour(s))  Culture, blood (routine x 2) Call MD if unable to obtain prior to antibiotics being given     Status: None (Preliminary result)   Collection Time: 10/22/16  6:33 PM  Result Value Ref Range Status   Specimen Description BLOOD LEFT ANTECUBITAL  Final   Special Requests IN PEDIATRIC BOTTLE 3CC  Final   Culture PENDING  Incomplete   Report Status PENDING  Incomplete  Culture, blood (routine x 2) Call MD if unable to obtain prior to antibiotics being given     Status: None (Preliminary result)   Collection Time: 10/22/16  6:33 PM  Result Value Ref Range Status   Specimen Description BLOOD RIGHT FOREARM  Final   Special Requests IN PEDIATRIC BOTTLE 3CC  Final   Culture PENDING  Incomplete   Report Status PENDING  Incomplete     Medications:   . ARIPiprazole  20 mg Oral Daily  . busPIRone  15 mg Oral TID  . furosemide  80 mg Oral Daily  . gabapentin  800 mg Oral QID  . heparin   5,000 Units Subcutaneous Q8H  . insulin aspart  0-15 Units Subcutaneous TID WC  . insulin aspart  0-5 Units Subcutaneous QHS  . insulin aspart  4 Units Subcutaneous TID WC  . irbesartan  37.5 mg Oral Daily  . labetalol  300 mg Oral BID  . levofloxacin (LEVAQUIN) IV  750 mg Intravenous Q24H  . metFORMIN  500 mg Oral TID WC  . methylPREDNISolone (SOLU-MEDROL)  injection  125 mg Intravenous Q6H  . mirtazapine  45 mg Oral QHS  . montelukast  10 mg Oral Daily  . Morphine-Naltrexone  1 capsule Oral Daily  . multivitamins with iron  1 tablet Oral Daily  . pantoprazole  40 mg Oral Daily  . sertraline  200 mg Oral Daily  . simvastatin  40 mg Oral Daily  . sodium chloride  2 spray Each Nare BID  . sodium chloride flush  3 mL Intravenous Q12H   Continuous Infusions: . sodium chloride 100 mL/hr at 10/22/16 2218  . albuterol 5 mg/hr (10/22/16 1330)    Time spent: 25 min   LOS: 1 day   Marinda ElkFELIZ ORTIZ, ABRAHAM  [facility] Pager [phone]  *Please refer to [email or web address], password [email or web address] to get updated schedule on who will round on this patient, as hospitalists switch teams weekly. If 7PM-7AM, please contact night-coverage at [email or web address], password [email or web address] for any overnight needs.  10/23/2016, 7:51 AM

## 2016-10-23 NOTE — Progress Notes (Signed)
  Echocardiogram 2D Echocardiogram has been performed.  Janalyn HarderWest, Jasmia Angst R 10/23/2016, 12:25 PM

## 2016-10-24 DIAGNOSIS — E119 Type 2 diabetes mellitus without complications: Secondary | ICD-10-CM

## 2016-10-24 DIAGNOSIS — Z794 Long term (current) use of insulin: Secondary | ICD-10-CM

## 2016-10-24 DIAGNOSIS — G4733 Obstructive sleep apnea (adult) (pediatric): Secondary | ICD-10-CM

## 2016-10-24 LAB — HIV ANTIBODY (ROUTINE TESTING W REFLEX): HIV SCREEN 4TH GENERATION: NONREACTIVE

## 2016-10-24 LAB — GLUCOSE, CAPILLARY
Glucose-Capillary: 157 mg/dL — ABNORMAL HIGH (ref 65–99)
Glucose-Capillary: 264 mg/dL — ABNORMAL HIGH (ref 65–99)

## 2016-10-24 MED ORDER — PREDNISONE 10 MG PO TABS: ORAL_TABLET | ORAL | 0 refills | Status: DC

## 2016-10-24 MED ORDER — AZITHROMYCIN 500 MG PO TABS: 500.0000 mg | ORAL_TABLET | Freq: Every day | ORAL | 0 refills | Status: DC

## 2016-10-24 NOTE — Progress Notes (Signed)
Spoke with patient at bedside, she will need home O2 at d/c, contacted [facility] to arrange, qualifying sats noted, order placed. Patient needs transportation assistance, [facility]. Requesting a nebulizer, attending states will need to get through PCP, she plans to f/u with her PCP and will request. No other HH needs identified.

## 2016-10-24 NOTE — Discharge Summary (Addendum)
Physician Discharge Summary  Lauren Jenkins:[id number] DOB: Oct 28, 1960 DOA: 10/22/2016  PCP: No primary care provider on file.  Admit date: 10/22/2016 Discharge date: 10/24/2016  Admitted From: home Disposition:  Home  Recommendations for Outpatient Follow-up:  1. Follow up with PCP in 1-2 weeks   Home Health:yes Equipment/Devices:Home oxygen  Discharge Condition:stable CODE STATUS:full Diet recommendation: Heart Healthy  Brief/Interim Summary:  56 y.o. female past medical history of hypertension obstructive sleep apnea asthma that has required intubation several years ago comes into the hospital progressive shortness of breath cough and chest tightness for the last  Discharge Diagnoses:  Principal Problem:   Acute asthma exacerbation Active Problems:   Obstructive sleep apnea   DM2 (diabetes mellitus, type 2) (HCC)   Hypertension   Acute bronchitis   Morbid obesity (HCC)  Acute asthma exacerbation: She was started on IV steroids and antibiotics it took her 2 days to improve. Once she was afebrile and her breathing was better she was changed to oral prednisone and Pseudomonas and we she'll continue her home for a total 5 days. She was ambulated and her oxygen saturation drop she'll go home on 2 weeks of oxygen.  Chronic heart failure undeterminated: No changes were made to his medication she was not in acute decompensated heart failure.  Obstructive sleep apnea: Continue CVAT.  Diabetes mellitus type 2: No changes were made to her medication, A1c was 7.9.  Essential hypertension: No changes made to her medication.  Morbid obesity: She was counseled. Next   Discharge Instructions  Discharge Instructions    Diet - low sodium heart healthy    Complete by:  As directed    Increase activity slowly    Complete by:  As directed      Allergies as of 10/24/2016      Reactions   Codeine Sulfate Shortness Of Breath   Ancef [cefazolin] Other (See Comments)    Intolerance    Apap [acetaminophen] Other (See Comments)   Intolerance    Elavil [amitriptyline] Other (See Comments)   Hallucinations   Haldol [haloperidol] Other (See Comments)   Intolerance    Lyrica [pregabalin] Other (See Comments)   agitation    Prednisone Other (See Comments)   Agitation    Procardia [nifedipine] Other (See Comments)   insensitivity   Rocephin [ceftriaxone] Swelling   angioedema and facial swelling   Clindamycin/lincomycin Rash   Lipitor [atorvastatin] Rash   Metoprolol Tartrate Rash   Zithromax [azithromycin] Rash      Medication List    TAKE these medications   albuterol 108 (90 Base) MCG/ACT inhaler Commonly known as:  PROVENTIL HFA;VENTOLIN HFA Inhale 2 puffs into the lungs every 4 (four) hours as needed for wheezing or shortness of breath.   ARIPiprazole 20 MG tablet Commonly known as:  ABILIFY Take 20 mg by mouth daily.   azithromycin 500 MG tablet Commonly known as:  ZITHROMAX Take 1 tablet (500 mg total) by mouth daily.   baclofen 10 MG tablet Commonly known as:  LIORESAL Take 10 mg by mouth 2 (two) times daily as needed for muscle spasms.   busPIRone 15 MG tablet Commonly known as:  BUSPAR Take 15 mg by mouth 3 (three) times daily.   CENTRAVITES 50 PLUS PO Take 1 tablet by mouth daily.   EMBEDA 50-2 MG Cpcr Generic drug:  Morphine-Naltrexone Take 1 capsule by mouth daily.   furosemide 40 MG tablet Commonly known as:  LASIX Take 40-80 mg by mouth daily.   gabapentin 800  MG tablet Commonly known as:  NEURONTIN Take 800 mg by mouth 4 (four) times daily.   labetalol 300 MG tablet Commonly known as:  NORMODYNE Take 300 mg by mouth 2 (two) times daily.   metFORMIN 500 MG tablet Commonly known as:  GLUCOPHAGE Take 500 mg by mouth 3 (three) times daily.   methocarbamol 500 MG tablet Commonly known as:  ROBAXIN Take 500 mg by mouth 3 (three) times daily.   mirtazapine 45 MG tablet Commonly known as:  REMERON Take 45 mg  by mouth at bedtime.   montelukast 10 MG tablet Commonly known as:  SINGULAIR Take 10 mg by mouth daily.   NOVOLOG FLEXPEN 100 UNIT/ML FlexPen Generic drug:  insulin aspart Inject 15 Units into the skin 3 (three) times daily with meals.   ondansetron 4 MG tablet Commonly known as:  ZOFRAN Take 4 mg by mouth every 8 (eight) hours as needed for nausea or vomiting.   OXAYDO 5 MG Taba Generic drug:  OxyCODONE HCl (Abuse Deter) take 2 tablets by mouth four times a day as needed for pain   pantoprazole 40 MG tablet Commonly known as:  PROTONIX Take 40 mg by mouth daily.   predniSONE 10 MG tablet Commonly known as:  DELTASONE Takes 6 tablets for 1 days, then 5 tablets for 1 days, then 4 tablets for 1 days, then 3 tablets for 1 days, then 2 tabs for 1 days, then 1 tab for 1 days, and then stop.   sertraline 100 MG tablet Commonly known as:  ZOLOFT Take 200 mg by mouth daily.   simvastatin 40 MG tablet Commonly known as:  ZOCOR Take 40 mg by mouth daily.   sodium chloride 0.65 % Soln nasal spray Commonly known as:  OCEAN Place 2 sprays into both nostrils 2 (two) times daily.   telmisartan 40 MG tablet Commonly known as:  MICARDIS Take 80 mg by mouth daily.            Durable Medical Equipment        Start     Ordered   10/24/16 1053  For home use only DME oxygen  Once    Question Answer Comment  Mode or (Route) Nasal cannula   Liters per Minute 2   Frequency Continuous (stationary and portable oxygen unit needed)   Oxygen delivery system Gas      10/24/16 1052      Allergies  Allergen Reactions  . Codeine Sulfate Shortness Of Breath  . Ancef [Cefazolin] Other (See Comments)    Intolerance   . Apap [Acetaminophen] Other (See Comments)    Intolerance    . Elavil [Amitriptyline] Other (See Comments)    Hallucinations  . Haldol [Haloperidol] Other (See Comments)    Intolerance   . Lyrica [Pregabalin] Other (See Comments)    agitation   . Prednisone  Other (See Comments)    Agitation   . Procardia [Nifedipine] Other (See Comments)    insensitivity  . Rocephin [Ceftriaxone] Swelling    angioedema and facial swelling  . Clindamycin/Lincomycin Rash  . Lipitor [Atorvastatin] Rash  . Metoprolol Tartrate Rash  . Zithromax [Azithromycin] Rash    Consultations:  None   Procedures/Studies: Dg Chest 2 View  Result Date: 10/22/2016 CLINICAL DATA:  Cough, chest pain. EXAM: CHEST  2 VIEW COMPARISON:  None. FINDINGS: Mild cardiomegaly is noted with central pulmonary vascular congestion. No pneumothorax or pleural effusion is noted. No consolidative process is noted in the lungs. Bony thorax is  unremarkable. IMPRESSION: Mild cardiomegaly with central pulmonary vascular congestion. Electronically Signed   By: Lupita Raider, M.D.   On: 10/22/2016 10:33    Subjective: No complains.  Discharge Exam: Vitals:   10/23/16 2322 10/24/16 0502  BP:  (!) 126/56  Pulse: (!) 55 (!) 56  Resp: 18 18  Temp:  97.8 F (36.6 C)   Vitals:   10/23/16 2315 10/23/16 2322 10/24/16 0502 10/24/16 0735  BP:   (!) 126/56   Pulse:  (!) 55 (!) 56   Resp:  18 18   Temp:   97.8 F (36.6 C)   TempSrc:   Oral   SpO2: 97% 97% 100% 93%  Weight:   112.5 kg (248 lb 0.3 oz)   Height:        General: Pt is alert, awake, not in acute distress Cardiovascular: RRR, S1/S2 +, no rubs, no gallops Respiratory: CTA bilaterally, no wheezing, no rhonchi Abdominal: Soft, NT, ND, bowel sounds + Extremities: no edema, no cyanosis    The results of significant diagnostics from this hospitalization (including imaging, microbiology, ancillary and laboratory) are listed below for reference.     Microbiology: Recent Results (from the past 240 hour(s))  Culture, blood (routine x 2) Call MD if unable to obtain prior to antibiotics being given     Status: None (Preliminary result)   Collection Time: 10/22/16  6:33 PM  Result Value Ref Range Status   Specimen Description  BLOOD LEFT ANTECUBITAL  Final   Special Requests IN PEDIATRIC BOTTLE 3CC  Final   Culture   Final    NO GROWTH < 24 HOURS Performed at [facility], [address], [place], [place] [address]    Report Status PENDING  Incomplete  Culture, blood (routine x 2) Call MD if unable to obtain prior to antibiotics being given     Status: None (Preliminary result)   Collection Time: 10/22/16  6:33 PM  Result Value Ref Range Status   Specimen Description BLOOD RIGHT FOREARM  Final   Special Requests IN PEDIATRIC BOTTLE 3CC  Final   Culture   Final    NO GROWTH < 24 HOURS Performed at [facility], [address] [place], [place] [address]    Report Status PENDING  Incomplete     Labs: BNP (last 3 results)  Recent Labs  10/22/16 1102 10/22/16 1833  BNP 253.6* 193.0*   Basic Metabolic Panel:  Recent Labs Lab 10/22/16 1102 10/22/16 1833 10/23/16 0516 10/23/16 1000  NA 137  --  137 134*  K 4.6  --  5.4* 5.6*  CL 104  --  103 100*  CO2 26  --  24 27  GLUCOSE 153*  --  189* 322*  BUN 17  --  19 22*  CREATININE 1.14*  --  0.91 1.19*  CALCIUM 9.3  --  9.4 9.7  MG  --  1.7  --   --   PHOS  --  3.6  --   --    Liver Function Tests: No results for input(s): AST, ALT, ALKPHOS, BILITOT, PROT, ALBUMIN in the last 168 hours. No results for input(s): LIPASE, AMYLASE in the last 168 hours. No results for input(s): AMMONIA in the last 168 hours. CBC:  Recent Labs Lab 10/22/16 1102 10/23/16 0516  WBC 12.8* 11.5*  NEUTROABS 10.3*  --   HGB 10.8* 10.5*  HCT 32.9* 32.3*  MCV 90.9 89.5  PLT 271 294   Cardiac Enzymes:  Recent Labs Lab 10/22/16 1833 10/22/16 2338 10/23/16 0516  TROPONINI 0.03* <0.03 <0.03   BNP: Invalid input(s): POCBNP CBG:  Recent Labs Lab 10/23/16 0742 10/23/16 1142 10/23/16 1725 10/23/16 2008 10/24/16 0721  GLUCAP 186* 268* 156* 210* 157*   D-Dimer No results for input(s): DDIMER in the last 72 hours. Hgb A1c  Recent Labs   10/22/16 1833  HGBA1C 7.9*   Lipid Profile No results for input(s): CHOL, HDL, LDLCALC, TRIG, CHOLHDL, LDLDIRECT in the last 72 hours. Thyroid function studies  Recent Labs  10/22/16 1833  TSH 0.650   Anemia work up No results for input(s): VITAMINB12, FOLATE, FERRITIN, TIBC, IRON, RETICCTPCT in the last 72 hours. Urinalysis    Component Value Date/Time   COLORURINE YELLOW 10/22/2016 1750   APPEARANCEUR CLEAR 10/22/2016 1750   LABSPEC 1.010 10/22/2016 1750   PHURINE 5.0 10/22/2016 1750   GLUCOSEU NEGATIVE 10/22/2016 1750   HGBUR NEGATIVE 10/22/2016 1750   BILIRUBINUR NEGATIVE 10/22/2016 1750   KETONESUR NEGATIVE 10/22/2016 1750   PROTEINUR NEGATIVE 10/22/2016 1750   NITRITE NEGATIVE 10/22/2016 1750   LEUKOCYTESUR NEGATIVE 10/22/2016 1750   Sepsis Labs Invalid input(s): PROCALCITONIN,  WBC,  LACTICIDVEN Microbiology Recent Results (from the past 240 hour(s))  Culture, blood (routine x 2) Call MD if unable to obtain prior to antibiotics being given     Status: None (Preliminary result)   Collection Time: 10/22/16  6:33 PM  Result Value Ref Range Status   Specimen Description BLOOD LEFT ANTECUBITAL  Final   Special Requests IN PEDIATRIC BOTTLE 3CC  Final   Culture   Final    NO GROWTH < 24 HOURS Performed at [facility], [address] [place], [place] [address]    Report Status PENDING  Incomplete  Culture, blood (routine x 2) Call MD if unable to obtain prior to antibiotics being given     Status: None (Preliminary result)   Collection Time: 10/22/16  6:33 PM  Result Value Ref Range Status   Specimen Description BLOOD RIGHT FOREARM  Final   Special Requests IN PEDIATRIC BOTTLE 3CC  Final   Culture   Final    NO GROWTH < 24 HOURS Performed at [facility], [address] [place], [place] [address]    Report Status PENDING  Incomplete     Time coordinating discharge: Over 30 minutes  SIGNED:   Marinda ElkFELIZ ORTIZ, Amauria Younts, MD  [facility] 10/24/2016, 11:05 AM Pager   If 7PM-7AM, please contact night-coverage [email or web address] Password [email or web address]

## 2016-10-24 NOTE — Progress Notes (Signed)
Completed D/C teaching with patient. Patient has oxygen for home. Discussed medications with patient and gave prescription. Patient will be transported home in stable condition.

## 2016-10-24 NOTE — Progress Notes (Signed)
SATURATION QUALIFICATIONS: (This note is used to comply with regulatory documentation for home oxygen)  Patient Saturations on Room Air at Rest = 94%  Patient Saturations on Room Air while Ambulating = 88%  Patient Saturations on 2 Liters of oxygen while Ambulating = 95%  Please briefly explain why patient needs home oxygen: Pt requires additional oxygen with activity.

## 2016-10-25 LAB — LEGIONELLA PNEUMOPHILA SEROGP 1 UR AG: L. pneumophila Serogp 1 Ur Ag: NEGATIVE

## 2016-10-27 LAB — CULTURE, BLOOD (ROUTINE X 2)
CULTURE: NO GROWTH
CULTURE: NO GROWTH

## 2016-12-19 ENCOUNTER — Emergency Department (HOSPITAL_COMMUNITY)
Admission: EM | Admit: 2016-12-19 | Discharge: 2016-12-19 | Disposition: A | Discharge status: Home / Self Care | Payer: Medicare Other | Attending: Emergency Medicine | Admitting: Emergency Medicine

## 2016-12-19 ENCOUNTER — Encounter (HOSPITAL_COMMUNITY): Payer: Self-pay | Admitting: Emergency Medicine

## 2016-12-19 DIAGNOSIS — N189 Chronic kidney disease, unspecified: Secondary | ICD-10-CM | POA: Insufficient documentation

## 2016-12-19 DIAGNOSIS — M5441 Lumbago with sciatica, right side: Secondary | ICD-10-CM | POA: Insufficient documentation

## 2016-12-19 DIAGNOSIS — R51 Headache: Secondary | ICD-10-CM | POA: Diagnosis not present

## 2016-12-19 DIAGNOSIS — Z794 Long term (current) use of insulin: Secondary | ICD-10-CM | POA: Diagnosis not present

## 2016-12-19 DIAGNOSIS — G8929 Other chronic pain: Secondary | ICD-10-CM | POA: Diagnosis not present

## 2016-12-19 DIAGNOSIS — J45909 Unspecified asthma, uncomplicated: Secondary | ICD-10-CM | POA: Insufficient documentation

## 2016-12-19 DIAGNOSIS — Z79899 Other long term (current) drug therapy: Secondary | ICD-10-CM | POA: Diagnosis not present

## 2016-12-19 DIAGNOSIS — M5442 Lumbago with sciatica, left side: Secondary | ICD-10-CM | POA: Insufficient documentation

## 2016-12-19 DIAGNOSIS — I129 Hypertensive chronic kidney disease with stage 1 through stage 4 chronic kidney disease, or unspecified chronic kidney disease: Secondary | ICD-10-CM | POA: Diagnosis not present

## 2016-12-19 DIAGNOSIS — R519 Headache, unspecified: Secondary | ICD-10-CM

## 2016-12-19 DIAGNOSIS — I10 Essential (primary) hypertension: Secondary | ICD-10-CM

## 2016-12-19 MED ORDER — DIPHENHYDRAMINE HCL 50 MG/ML IJ SOLN
25.0000 mg | Freq: Once | INTRAMUSCULAR | Status: AC
  Administered 2016-12-19: 25 mg via INTRAVENOUS
  Filled 2016-12-19: qty 1

## 2016-12-19 MED ORDER — SODIUM CHLORIDE 0.9 % IV BOLUS (SEPSIS)
1000.0000 mL | Freq: Once | INTRAVENOUS | Status: AC
  Administered 2016-12-19: 1000 mL via INTRAVENOUS

## 2016-12-19 MED ORDER — METOCLOPRAMIDE HCL 5 MG/ML IJ SOLN
10.0000 mg | Freq: Once | INTRAMUSCULAR | Status: AC
  Administered 2016-12-19: 10 mg via INTRAVENOUS
  Filled 2016-12-19: qty 2

## 2016-12-19 NOTE — Discharge Instructions (Signed)

## 2016-12-19 NOTE — ED Triage Notes (Signed)
Per [facility], pt from doctors office, c/o L arm pain and blurred vision since yesterday, HTN at office, 210/112. Stroke scale negative by ems. NSR on monitor, cbg 166. Pt is on 02 as needed. 94% on RA

## 2016-12-19 NOTE — ED Provider Notes (Signed)
[facility]ENCY DEPT Provider Note   CSN: [id number] Arrival date & time: 12/19/16  1139     History   Chief Complaint Chief Complaint  Patient presents with  . Hypertension    HPI Lauren Jenkins is a 56 y.o. female with history of chronic pain syndrome who presents today with worsening pain of her low back,With radiation to bilateral lower extremities, left arm pain, and headache.   She endorses "all over" joint pain and back pain, which is intermittently sharp and dull at times radiating down bilateral legs right greater than left. She states pain acutely worsened all of a sudden this week. Movement makes pain worse laying down on her side is helpful. She is followed by pain management and takes Embeda, Oxaydo, and has been using ice and heat to the effected areas with some improvement. She reports associated nausea with her pain but no vomiting. She was scheduled for a procedure with pain management today but was found to have a blood pressure of 210/112 and was sent to the emergency department for evaluation.   Left arm pain worsened 2 days ago, is constant, achy, and radiates from her shoulder down to her wrist. Movement aggravates the pain. She denies any trauma or falls.  She has a history of migraine headaches and on arrival to the ED she states she developed mild pain to the top of her head. She also endorses 2 days of blurry double vision which is intermittent and decreased sensation to the right side of her body. She states this is sometimes typical of her migraine symptoms. She has not tried anything for her symptoms.   Denies CP, worsening SOB, new numbness, tingling,  Or weakness, abd pain, v/d, dysuria, melena, hematochezia    The history is provided by the patient.    Past Medical History:  Diagnosis Date  . Asthma   . Asthma   . Chronic anemia   . Chronic pain syndrome 10/22/2016  . CKD (chronic kidney disease)   . Depression   . DM2 (diabetes mellitus, type 2)  (HCC)   . Fibromyalgia 10/22/2016  . Hypercholesterolemia   . Hypertension   . Hypertension   . Lupus   . Obstructive sleep apnea   . Personality disorder     Patient Active Problem List   Diagnosis Date Noted  . Morbid obesity (HCC) 10/23/2016  . Obstructive sleep apnea 10/22/2016  . DM2 (diabetes mellitus, type 2) (HCC) 10/22/2016  . Hypertension 10/22/2016  . Acute asthma exacerbation 10/22/2016  . Acute bronchitis 10/22/2016    History reviewed. No pertinent surgical history.  OB History    No data available       Home Medications    Prior to Admission medications   Medication Sig Start Date End Date Taking? Authorizing Provider  albuterol (PROVENTIL HFA;VENTOLIN HFA) 108 (90 Base) MCG/ACT inhaler Inhale 2 puffs into the lungs every 4 (four) hours as needed for wheezing or shortness of breath.   Yes Historical Provider, MD  ARIPiprazole (ABILIFY) 20 MG tablet Take 20 mg by mouth daily.   Yes Historical Provider, MD  baclofen (LIORESAL) 10 MG tablet Take 10 mg by mouth 2 (two) times daily as needed for muscle spasms.   Yes Historical Provider, MD  busPIRone (BUSPAR) 15 MG tablet Take 15 mg by mouth 3 (three) times daily.   Yes Historical Provider, MD  EMBEDA 50-2 MG CPCR Take 1 capsule by mouth daily.  10/06/16  Yes Historical Provider, MD  furosemide (LASIX) 40  MG tablet Take 40-80 mg by mouth daily.   Yes Historical Provider, MD  gabapentin (NEURONTIN) 800 MG tablet Take 800 mg by mouth 4 (four) times daily.   Yes Historical Provider, MD  Insulin Glargine (LANTUS SOLOSTAR) 100 UNIT/ML Solostar Pen Inject 10 Units into the skin daily at 10 pm.    Yes Historical Provider, MD  insulin lispro (HUMALOG) 100 UNIT/ML injection Inject 10 Units into the skin 3 (three) times daily before meals.   Yes Historical Provider, MD  labetalol (NORMODYNE) 300 MG tablet Take 300 mg by mouth 2 (two) times daily.   Yes Historical Provider, MD  metFORMIN (GLUCOPHAGE) 500 MG tablet Take 500 mg  by mouth 3 (three) times daily.   Yes Historical Provider, MD  methocarbamol (ROBAXIN) 500 MG tablet Take 500 mg by mouth 3 (three) times daily.  10/02/16  Yes Historical Provider, MD  mirtazapine (REMERON) 45 MG tablet Take 45 mg by mouth at bedtime.   Yes Historical Provider, MD  montelukast (SINGULAIR) 10 MG tablet Take 10 mg by mouth daily.   Yes Historical Provider, MD  Multiple Vitamins-Minerals (CENTRAVITES 50 PLUS PO) Take 1 tablet by mouth daily.   Yes Historical Provider, MD  ondansetron (ZOFRAN) 4 MG tablet Take 4 mg by mouth every 8 (eight) hours as needed for nausea or vomiting.   Yes Historical Provider, MD  pantoprazole (PROTONIX) 40 MG tablet Take 40 mg by mouth daily.   Yes Historical Provider, MD  sertraline (ZOLOFT) 100 MG tablet Take 200 mg by mouth daily.   Yes Historical Provider, MD  simvastatin (ZOCOR) 40 MG tablet Take 40 mg by mouth daily.   Yes Historical Provider, MD  sodium chloride (OCEAN) 0.65 % SOLN nasal spray Place 2 sprays into both nostrils 2 (two) times daily.   Yes Historical Provider, MD  telmisartan (MICARDIS) 40 MG tablet Take 80 mg by mouth daily.   Yes Historical Provider, MD  azithromycin (ZITHROMAX) 500 MG tablet Take 1 tablet (500 mg total) by mouth daily. Patient not taking: Reported on 12/19/2016 10/24/16   Marinda Elk, MD  insulin aspart (NOVOLOG FLEXPEN) 100 UNIT/ML FlexPen Inject 15 Units into the skin 3 (three) times daily with meals.    Historical Provider, MD  OXAYDO 5 MG TABA take 2 tablets by mouth four times a day as needed for pain 10/02/16   Historical Provider, MD  predniSONE (DELTASONE) 10 MG tablet Takes 6 tablets for 1 days, then 5 tablets for 1 days, then 4 tablets for 1 days, then 3 tablets for 1 days, then 2 tabs for 1 days, then 1 tab for 1 days, and then stop. Patient not taking: Reported on 12/19/2016 10/24/16   Marinda Elk, MD    Family History No family history on file.  Social History Social History  Substance Use  Topics  . Smoking status: Never Smoker  . Smokeless tobacco: Never Used  . Alcohol use No     Allergies   Codeine sulfate; Ancef [cefazolin]; Apap [acetaminophen]; Elavil [amitriptyline]; Haldol [haloperidol]; Lyrica [pregabalin]; Prednisone; Procardia [nifedipine]; Rocephin [ceftriaxone]; Clindamycin/lincomycin; Lipitor [atorvastatin]; Metoprolol tartrate; and Zithromax [azithromycin]   Review of Systems Review of Systems  Constitutional: Negative for chills and fever.  Respiratory: Negative for shortness of breath.   Cardiovascular: Negative for chest pain.  Gastrointestinal: Positive for nausea. Negative for abdominal pain, blood in stool, diarrhea and vomiting.  Genitourinary: Negative for dysuria and hematuria.  Musculoskeletal: Positive for arthralgias, back pain and myalgias.  Neurological: Positive for  headaches.     Physical Exam Updated Vital Signs BP (!) 177/84   Pulse 72   Temp 97.5 F (36.4 C)   Resp (!) 23   SpO2 93%   Physical Exam  Constitutional: She appears well-developed and well-nourished. No distress.  HENT:  Head: Normocephalic and atraumatic.  Mouth/Throat: Oropharynx is clear and moist.  Eyes: Conjunctivae and EOM are normal. Pupils are equal, round, and reactive to light. Right eye exhibits no discharge. Left eye exhibits no discharge. No scleral icterus.  Neck: Normal range of motion. Neck supple. No JVD present. No tracheal deviation present.  No midline CSP TTP, paraspinal muscle spasm noted.  Cardiovascular: Normal rate, regular rhythm, normal heart sounds and intact distal pulses.   2+ radial and DP/PT pulses bl, negative Homan's bl   Pulmonary/Chest: Effort normal and breath sounds normal.  Abdominal: Soft. Bowel sounds are normal. She exhibits no distension. There is no tenderness.  Musculoskeletal: She exhibits tenderness. She exhibits no edema or deformity.  Midline LSP TTP, no paraspinal muscle tenderness. Pain with extension and  flexeion of LSP and upward motion of left shoulder. Diffusely tender to palpation of joints. 5/5 strength BUE and BLE, good grip strength.   Lymphadenopathy:    She has no cervical adenopathy.  Neurological:  Fluent speech, no facial droop, decreased sensation on the left side of face, L arm, and L leg, normal gait, and patient able to heel walk and toe walk. Negative Romberg. No pronator drift.   Skin: Skin is warm and dry. Capillary refill takes less than 2 seconds. She is not diaphoretic.  Psychiatric: She has a normal mood and affect. Her behavior is normal.     ED Treatments / Results  Labs (all labs ordered are listed, but only abnormal results are displayed) Labs Reviewed - No data to display  EKG  EKG Interpretation  Date/Time:  Tuesday Dec 19 2016 11:50:03 EDT Ventricular Rate:  70 PR Interval:    QRS Duration: 100 QT Interval:  435 QTC Calculation: 470 R Axis:   50 Text Interpretation:  Sinus rhythm Consider left atrial enlargement No significant change since last tracing Confirmed by [facility] [place] MD, PEDRO ([id number]) on 12/19/2016 1:16:11 PM       Radiology No results found.  Procedures Procedures (including critical care time)  Medications Ordered in ED Medications  metoCLOPramide (REGLAN) injection 10 mg (10 mg Intravenous Given 12/19/16 1249)  diphenhydrAMINE (BENADRYL) injection 25 mg (25 mg Intravenous Given 12/19/16 1249)  sodium chloride 0.9 % bolus 1,000 mL (0 mLs Intravenous Stopped 12/19/16 1447)     Initial Impression / Assessment and Plan / ED Course  I have reviewed the triage vital signs and the nursing notes.  Pertinent labs & imaging results that were available during my care of the patient were reviewed by me and considered in my medical decision making (see chart for details).     Patient with chronic pain syndrome presents with elevated blood pressure and generalized pain and migraine headache.  Patient afebrile, with improvement in her blood pressure  throughout her stay in the ED.  Vision changes and altered sensation consistent with her migraines.  Given Reglan and Benadryl and 1 L normal saline with improvement in headache symptoms.  EKG with no significant change from last.  Low suspicion of stroke or MI/ACS.  With improvement in blood pressure to baseline, patient safe for discharge home.  Discuss continued use of her pain medications, heat, ice, gentle stretching for comfort.  Recommend follow-up  with her pain management in the next 1-2 days.  Discussed strict ED return precautions. Pt verbalized understanding of and agreement with plan and is safe for discharge home at this time.  Final Clinical Impressions(s) / ED Diagnoses   Final diagnoses:  Chronic bilateral low back pain with bilateral sciatica  Hypertension, unspecified type  Bad headache    New Prescriptions Discharge Medication List as of 12/19/2016  2:19 PM       Jeanie Sewer, PA-C 12/19/16 2058    Nira Conn, MD 12/22/16 [phone]

## 2016-12-28 ENCOUNTER — Encounter (INDEPENDENT_AMBULATORY_CARE_PROVIDER_SITE_OTHER): Payer: Self-pay

## 2016-12-28 ENCOUNTER — Ambulatory Visit (INDEPENDENT_AMBULATORY_CARE_PROVIDER_SITE_OTHER): Payer: Medicare Other | Admitting: Psychiatry

## 2016-12-28 ENCOUNTER — Encounter (HOSPITAL_COMMUNITY): Payer: Self-pay | Admitting: Psychiatry

## 2016-12-28 VITALS — BP 144/76 | HR 64 | Ht 64.5 in | Wt 258.0 lb

## 2016-12-28 DIAGNOSIS — Z794 Long term (current) use of insulin: Secondary | ICD-10-CM

## 2016-12-28 DIAGNOSIS — F17210 Nicotine dependence, cigarettes, uncomplicated: Secondary | ICD-10-CM | POA: Diagnosis not present

## 2016-12-28 DIAGNOSIS — F200 Paranoid schizophrenia: Secondary | ICD-10-CM | POA: Diagnosis not present

## 2016-12-28 DIAGNOSIS — Z79899 Other long term (current) drug therapy: Secondary | ICD-10-CM | POA: Diagnosis not present

## 2016-12-28 MED ORDER — ZIPRASIDONE HCL 40 MG PO CAPS: 40.0000 mg | ORAL_CAPSULE | Freq: Two times a day (BID) | ORAL | 0 refills | Status: DC
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
## 2017-01-15 ENCOUNTER — Ambulatory Visit (HOSPITAL_COMMUNITY): Payer: Self-pay | Admitting: Psychiatry

## 2017-01-23 ENCOUNTER — Ambulatory Visit (HOSPITAL_COMMUNITY): Payer: Self-pay | Admitting: Psychiatry

## 2017-02-01 ENCOUNTER — Other Ambulatory Visit (HOSPITAL_COMMUNITY): Payer: Self-pay

## 2017-02-01 DIAGNOSIS — M170 Bilateral primary osteoarthritis of knee: Secondary | ICD-10-CM | POA: Insufficient documentation

## 2017-02-01 DIAGNOSIS — Z8639 Personal history of other endocrine, nutritional and metabolic disease: Secondary | ICD-10-CM | POA: Insufficient documentation

## 2017-02-01 DIAGNOSIS — Z8679 Personal history of other diseases of the circulatory system: Secondary | ICD-10-CM | POA: Insufficient documentation

## 2017-02-01 DIAGNOSIS — F200 Paranoid schizophrenia: Secondary | ICD-10-CM

## 2017-02-01 DIAGNOSIS — Z720 Tobacco use: Secondary | ICD-10-CM | POA: Insufficient documentation

## 2017-02-01 DIAGNOSIS — M0609 Rheumatoid arthritis without rheumatoid factor, multiple sites: Secondary | ICD-10-CM | POA: Insufficient documentation

## 2017-02-01 DIAGNOSIS — Z8659 Personal history of other mental and behavioral disorders: Secondary | ICD-10-CM | POA: Insufficient documentation

## 2017-02-01 DIAGNOSIS — M5136 Other intervertebral disc degeneration, lumbar region: Secondary | ICD-10-CM | POA: Insufficient documentation

## 2017-02-01 DIAGNOSIS — Z79899 Other long term (current) drug therapy: Secondary | ICD-10-CM | POA: Insufficient documentation

## 2017-02-01 DIAGNOSIS — Z8709 Personal history of other diseases of the respiratory system: Secondary | ICD-10-CM | POA: Insufficient documentation

## 2017-02-01 DIAGNOSIS — Z87898 Personal history of other specified conditions: Secondary | ICD-10-CM | POA: Insufficient documentation

## 2017-02-01 DIAGNOSIS — E785 Hyperlipidemia, unspecified: Secondary | ICD-10-CM | POA: Insufficient documentation

## 2017-02-01 DIAGNOSIS — Z8719 Personal history of other diseases of the digestive system: Secondary | ICD-10-CM | POA: Insufficient documentation

## 2017-02-01 DIAGNOSIS — Z9049 Acquired absence of other specified parts of digestive tract: Secondary | ICD-10-CM | POA: Insufficient documentation

## 2017-02-01 DIAGNOSIS — R768 Other specified abnormal immunological findings in serum: Secondary | ICD-10-CM | POA: Insufficient documentation

## 2017-02-01 DIAGNOSIS — Z862 Personal history of diseases of the blood and blood-forming organs and certain disorders involving the immune mechanism: Secondary | ICD-10-CM | POA: Insufficient documentation

## 2017-02-01 MED ORDER — ZIPRASIDONE HCL 40 MG PO CAPS: 40.0000 mg | ORAL_CAPSULE | Freq: Two times a day (BID) | ORAL | 0 refills | Status: AC

## 2017-02-01 NOTE — Progress Notes (Signed)
Office Visit Note  Patient: Lauren Jenkins             Date of Birth: 05-25-1961           MRN: [id number]             PCP: Seward Carol, MD Referring: Seward Carol, MD Visit Date: 02/02/2017 Occupation: @GUAROCC @    Subjective:  Pain and swelling in multiple joints   History of Present Illness: [name] is a 56 y.o. female seen in consultation per request of her PCP. According to patient she has had long-standing history of osteoarthritis to her knee joints. She had left knee joint arthroscopic surgery in the past. About 2-1/2 years ago she was diagnosed with rheumatoid arthritis by her rheumatologist in [place] area and she states Dr. Posey Pronto tried her on Plaquenil and methotrexate. She was also tried on leflunomide. Then she was switched to Humira. She recalls taking Humira shots for about 6 months and then she moved to [place] about a year ago. She states she has not had any treatment in one year. She states she had good response to Humira. She's been having increased joint pain and swelling now. She describes pain and discomfort in her bilateral shoulders, bilateral elbows, bilateral wrist joints, bilateral hands, her lateral hip joints, bilateral knee joints, bilateral ankles and feet. She states her hands and feet is still swollen. She's been also having swelling in her knee joints. 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 She's been having increased pain and discomfort in her lower back now. She is [facility]. He recommended surgery for lumbar spine. She continues to have a lot of depression. He is seeing a psychiatrist. She is a still smoking about half a pack per day.   Activities of Daily Living:  Patient reports morning stiffness for 30 minutes.   Patient Reports nocturnal pain.  Difficulty dressing/grooming: Reports Difficulty climbing stairs:  Reports Difficulty getting out of chair: Reports Difficulty using hands for taps, buttons, cutlery, and/or writing: Reports   Review of Systems  Constitutional: Negative for fatigue, night sweats, weight gain, weight loss and weakness.  HENT: Positive for mouth dryness. Negative for mouth sores, trouble swallowing, trouble swallowing and nose dryness.   Eyes: Positive for dryness. Negative for pain, redness and visual disturbance.  Respiratory: Negative for cough, shortness of breath and difficulty breathing.   Cardiovascular: Negative for chest pain, palpitations, hypertension, irregular heartbeat and swelling in legs/feet.  Gastrointestinal: Positive for constipation. Negative for blood in stool and diarrhea.  Endocrine: Negative for increased urination.  Genitourinary: Negative for vaginal dryness.  Musculoskeletal: Positive for arthralgias, joint pain, joint swelling, myalgias, morning stiffness and myalgias. Negative for muscle weakness and muscle tenderness.  Skin: Positive for rash. Negative for color change, hair loss, skin tightness, ulcers and sensitivity to sunlight.  Allergic/Immunologic: Negative for susceptible to infections.  Neurological: Negative for dizziness, memory loss and night sweats.  Hematological: Negative for swollen glands.  Psychiatric/Behavioral: Positive for depressed mood and sleep disturbance. The patient is nervous/anxious.     PMFS History:  Patient Active Problem List   Diagnosis Date Noted  . Rheumatoid arthritis of multiple sites with negative rheumatoid factor ([place]) 02/01/2017  . Primary osteoarthritis of both knees 02/01/2017  . History of diabetes mellitus 02/01/2017  . History of gastroesophageal reflux (GERD) 02/01/2017  . History of asthma 02/01/2017  . History of hypertension  02/01/2017  . History of chronic pain/ in pain management 02/01/2017  . History of depression and personality disorder/  02/01/2017  . Tobacco abuse 02/01/2017  .  DDD (degenerative disc disease), lumbar 02/01/2017  . ANA positive 02/01/2017  . High risk medication use 02/01/2017  . History of cholecystectomy 02/01/2017  . History of anemia 02/01/2017  . Dyslipidemia 02/01/2017  . Morbid obesity ([place]) 10/23/2016  . Obstructive sleep apnea 10/22/2016  . DM2 (diabetes mellitus, type 2) ([place]) 10/22/2016  . Hypertension 10/22/2016  . Acute asthma exacerbation 10/22/2016  . Acute bronchitis 10/22/2016    Past Medical History:  Diagnosis Date  . Asthma   . Asthma   . Cataracts, bilateral   . Chronic anemia   . Chronic pain syndrome 10/22/2016  . CKD (chronic kidney disease)   . Depression   . DM2 (diabetes mellitus, type 2) ([place])   . Fibromyalgia 10/22/2016  . Hypercholesterolemia   . Hypertension   . Hypertension   . Lupus   . Obstructive sleep apnea   . Personality disorder     Family History  Problem Relation Age of Onset  . Cancer Mother   . Thyroid cancer Mother   . Osteoporosis Mother   . Heart disease Mother   . Multiple sclerosis Sister   . Diabetes Maternal Grandmother   . Osteoporosis Maternal Grandfather   . Diabetes Paternal Grandmother   . Cancer Paternal Grandmother   . Osteoporosis Paternal Grandmother   . Lupus Other    Past Surgical History:  Procedure Laterality Date  . ABDOMINAL HYSTERECTOMY    . BUNIONECTOMY    . CERVICAL FUSION    . GALLBLADDER SURGERY    . KNEE ARTHROSCOPY    . OOPHORECTOMY    . SPINAL FUSION     Social History   Social History Narrative  . No narrative on file     Objective: Vital Signs: BP (!) 160/103 (BP Location: Left Arm, Patient Position: Sitting, Cuff Size: Normal)   Pulse 99   Resp 14   Ht 5' 3"  (1.6 m)   Wt 244 lb (110.7 kg)   BMI 43.22 kg/m    Physical Exam  Constitutional: She is oriented to person, place, and time. She appears well-developed and well-nourished.  HENT:  Head: Normocephalic and atraumatic.  Eyes: Conjunctivae and EOM are normal.  Neck: Normal  range of motion.  Cardiovascular: Normal rate, regular rhythm, normal heart sounds and intact distal pulses.   Pulmonary/Chest: Effort normal and breath sounds normal.  Abdominal: Soft. Bowel sounds are normal.  Lymphadenopathy:    She has no cervical adenopathy.  Neurological: She is alert and oriented to person, place, and time.  Skin: Skin is warm and dry. Capillary refill takes less than 2 seconds.  Psychiatric: She has a normal mood and affect. Her behavior is normal.  Nursing note and vitals reviewed.    Musculoskeletal Exam: C-spine good range of motion. She has thoracic kyphosis. She is painful range of motion of lumbar spine which was limited. Shoulder joints elbow joints wrist joints are good range of motion. She has tenderness across her wrist joint some of the MCPs and PIPs as described below. I could not appreciate any synovitis on examination. She had painful range of motion of her hip joints knee joints ankles MTPs PIPs. She has postsurgical change on her right first toes due to bunionectomy. No synovitis was noted.  CDAI Exam: CDAI Homunculus Exam:   Tenderness:  LUE: glenohumeral Right hand: 1st  MCP, 2nd MCP, 3rd MCP, 4th MCP, 2nd PIP and 3rd PIP Left hand: 1st MCP, 3rd PIP and 4th PIP RLE: acetabulofemoral and tibiofemoral LLE: acetabulofemoral and tibiofemoral  Joint Counts:  CDAI Tender Joint count: 12 CDAI Swollen Joint count: 0  Global Assessments:  Patient Global Assessment: 7 Provider Global Assessment: 3  CDAI Calculated Score: 22    Investigation: Findings:  11/24/16 Total Cholesterol 307, Triglycerides 372, LDL 184  Hgb A1C 8.7, CMP glucose 136 GFR 51 ALP 146 AST ALT normal   09/29/2016 RF less than 10 negative, Sed Rate 66, ANA positive, no titer performed  06/24/2015 CRP 12.6, Sed Rate 56  Records from previous rheumatologist Dr Laretta Bolster  indicate patient has been treated with Humira  Methotrexate Tablets, Plaquenil, and Leflunomide in the past  (2016-2017) records indicate patient only used one injection of Humira, had infection, and d/c med.    Imaging: Xr Foot 2 Views Left  Result Date: 02/02/2017 Left first MTP all PIP/DIP narrowing was noted. None of the other MTPs showed narrowing or erosive changes. No intertarsal joint space narrowing was noted. Small inferior and posterior calcaneal spurs were noted. Impression: These findings were consistent with osteoarthritis  Xr Foot 2 Views Right  Result Date: 02/02/2017 Postsurgical changes noted in the right first phalanx. None of the other MTPs show any narrowing or erosive changes. PIP/DIP narrowing was noted in all digits. No intertarsal joint space narrowing was noted. Small inferior and posterior calcaneal spurs were noted. Impression: These findings were consistent with osteoarthritis of the foot.  Xr Hand 2 View Left  Result Date: 02/02/2017 Minimal PIP/DIP narrowing was noted. No MCP joint pain or intercarpal joint space narrowing was noted. No erosive changes were noted. Impression: These findings were consistent with mild osteoarthritis  Xr Hand 2 View Right  Result Date: 02/02/2017 Minimal PIP/DIP narrowing was noted. No MCP joint space or intercarpal joint space narrowing was noted. No erosive changes were noted. Impression: These findings are consistent with osteoarthritis  Xr Knee 3 View Left  Result Date: 02/02/2017 Moderate to severe medial compartment narrowing. Moderate patellofemoral narrowing. Impression: Moderate to severe osteoarthritis and moderate chondromalacia patella  Xr Knee 3 View Right  Result Date: 02/02/2017 Moderate medial compartment narrowing. Moderate chondromalacia patella. No chondrocalcinosis. Impression: Moderate osteoarthritis and moderate chondromalacia patella   Speciality Comments: No specialty comments available.    Procedures:  No procedures performed Allergies: Codeine sulfate; Ancef [cefazolin]; Apap [acetaminophen]; Elavil  [amitriptyline]; Haldol [haloperidol]; Lyrica [pregabalin]; Prednisone; Procardia [nifedipine]; Rocephin [ceftriaxone]; Clindamycin/lincomycin; Lipitor [atorvastatin]; Metoprolol tartrate; and Zithromax [azithromycin]   Assessment / Plan:     Visit Diagnoses: Rheumatoid arthritis of multiple sites with negative rheumatoid factor (HCC) - RF<10,ESR 66, ANA +. Patient has been diagnosed with rheumatoid arthritis in the past by her rheumatologist in [place] and has been treated with multiple medications. Today she complains of polyarthralgia I do not find any synovitis on examination. I would obtain following labs and x-rays today to evaluate this further. I would also like to schedule an ultrasound to evaluate further.  High risk medication use - Treated with Plaquenil, methotrexate, leflunomide, Humira for about 6 months. No treatment for one year. by Dr. Posey Pronto in [place]  ANA positive: She has no clinical features of autoimmune disease on examination  Primary osteoarthritis of both knees - Status post left knee joint arthroscopic surgery . She has ongoing pain in her knee joints.- Plan: XR KNEE 3 VIEW RIGHT, XR KNEE 3 VIEW LEFT. X-ray  of bilateral knee joints showed moderate to severe osteoarthritis and chondromalacia patella  Pain in both hands - Plan: Rheumatoid factor, Cyclic citrul peptide antibody, IgG, 14-3-3 eta Protein, ANA, Uric acid. X-ray of bilateral hands showed mild osteoarthritis. No MCP changes or erosive changes were noted.  Pain in both feet - Plan: XR Hand 2 View Right, XR Hand 2 View Left, XR Foot 2 Views Right, XR Foot 2 Views Left. X-rays revealed postsurgical changes in the right foot and osteoarthritic changes and bilateral feet.  Other fatigue - Plan: CBC with Differential/Platelet, COMPLETE METABOLIC PANEL WITH GFR, Urinalysis, Routine w reflex microscopic, Sedimentation rate, CK, TSH, Serum protein electrophoresis with reflex, Glucose 6 phosphate dehydrogenase,  Hepatitis B core antibody, IgM, Hepatitis B surface antigen, Hepatitis C antibody, IgG, IgA, IgM, Quantiferon tb gold assay (blood)   DDD (degenerative disc disease), lumbar history of lumbar fusion: She's been followed by pain management  History of chronic pain/ in pain management  History of diabetes mellitus  History of gastroesophageal reflux (GERD)  History of asthma  History of hypertension  History of depression and personality disorder/ schizophrenia - [facility]   Tobacco abuse  Morbid obesity ([place])  History of cholecystectomy  History of anemia  Dyslipidemia    Orders: Orders Placed This Encounter  Procedures  . XR Hand 2 View Right  . XR Hand 2 View Left  . XR KNEE 3 VIEW RIGHT  . XR KNEE 3 VIEW LEFT  . XR Foot 2 Views Right  . XR Foot 2 Views Left  . CBC with Differential/Platelet  . COMPLETE METABOLIC PANEL WITH GFR  . Urinalysis, Routine w reflex microscopic  . Sedimentation rate  . CK  . TSH  . Rheumatoid factor  . Cyclic citrul peptide antibody, IgG  . 14-3-3 eta Protein  . ANA  . Serum protein electrophoresis with reflex  . Glucose 6 phosphate dehydrogenase  . Hepatitis B core antibody, IgM  . Hepatitis B surface antigen  . Hepatitis C antibody  . IgG, IgA, IgM  . Quantiferon tb gold assay (blood)  . Uric acid   No orders of the defined types were placed in this encounter.   Face-to-face time spent with patient was 60 minutes. 50% of time was spent in counseling and coordination of care.  Follow-Up Instructions: Return for Polyarthralgia.   Bo Merino, MD  Note - This record has been created using Editor, commissioning.  Chart creation errors have been sought, but may not always  have been located. Such creation errors do not reflect on  the standard of medical care.

## 2017-02-02 ENCOUNTER — Ambulatory Visit (INDEPENDENT_AMBULATORY_CARE_PROVIDER_SITE_OTHER): Payer: Medicare Other

## 2017-02-02 ENCOUNTER — Other Ambulatory Visit: Payer: Self-pay | Admitting: Rheumatology

## 2017-02-02 ENCOUNTER — Ambulatory Visit (INDEPENDENT_AMBULATORY_CARE_PROVIDER_SITE_OTHER): Payer: Medicare Other | Admitting: Rheumatology

## 2017-02-02 ENCOUNTER — Encounter: Payer: Self-pay | Admitting: Rheumatology

## 2017-02-02 VITALS — BP 160/103 | HR 99 | Resp 14 | Ht 63.0 in | Wt 244.0 lb

## 2017-02-02 DIAGNOSIS — Z79899 Other long term (current) drug therapy: Secondary | ICD-10-CM

## 2017-02-02 DIAGNOSIS — Z8719 Personal history of other diseases of the digestive system: Secondary | ICD-10-CM | POA: Diagnosis not present

## 2017-02-02 DIAGNOSIS — Z8679 Personal history of other diseases of the circulatory system: Secondary | ICD-10-CM

## 2017-02-02 DIAGNOSIS — Z8659 Personal history of other mental and behavioral disorders: Secondary | ICD-10-CM

## 2017-02-02 DIAGNOSIS — Z8639 Personal history of other endocrine, nutritional and metabolic disease: Secondary | ICD-10-CM | POA: Diagnosis not present

## 2017-02-02 DIAGNOSIS — E785 Hyperlipidemia, unspecified: Secondary | ICD-10-CM

## 2017-02-02 DIAGNOSIS — M0609 Rheumatoid arthritis without rheumatoid factor, multiple sites: Secondary | ICD-10-CM

## 2017-02-02 DIAGNOSIS — M79671 Pain in right foot: Secondary | ICD-10-CM

## 2017-02-02 DIAGNOSIS — R768 Other specified abnormal immunological findings in serum: Secondary | ICD-10-CM

## 2017-02-02 DIAGNOSIS — M170 Bilateral primary osteoarthritis of knee: Secondary | ICD-10-CM | POA: Diagnosis not present

## 2017-02-02 DIAGNOSIS — R5383 Other fatigue: Secondary | ICD-10-CM

## 2017-02-02 DIAGNOSIS — M5136 Other intervertebral disc degeneration, lumbar region: Secondary | ICD-10-CM

## 2017-02-02 DIAGNOSIS — Z720 Tobacco use: Secondary | ICD-10-CM

## 2017-02-02 DIAGNOSIS — Z9049 Acquired absence of other specified parts of digestive tract: Secondary | ICD-10-CM

## 2017-02-02 DIAGNOSIS — Z87898 Personal history of other specified conditions: Secondary | ICD-10-CM

## 2017-02-02 DIAGNOSIS — M79672 Pain in left foot: Secondary | ICD-10-CM

## 2017-02-02 DIAGNOSIS — M79642 Pain in left hand: Secondary | ICD-10-CM

## 2017-02-02 DIAGNOSIS — Z8709 Personal history of other diseases of the respiratory system: Secondary | ICD-10-CM

## 2017-02-02 DIAGNOSIS — M79641 Pain in right hand: Secondary | ICD-10-CM

## 2017-02-02 DIAGNOSIS — Z862 Personal history of diseases of the blood and blood-forming organs and certain disorders involving the immune mechanism: Secondary | ICD-10-CM

## 2017-02-02 LAB — COMPLETE METABOLIC PANEL WITH GFR
ALT: 19 U/L (ref 6–29)
AST: 16 U/L (ref 10–35)
Albumin: 4 g/dL (ref 3.6–5.1)
Alkaline Phosphatase: 146 U/L — ABNORMAL HIGH (ref 33–130)
BUN: 11 mg/dL (ref 7–25)
CALCIUM: 9.5 mg/dL (ref 8.6–10.4)
CO2: 25 mmol/L (ref 20–31)
Chloride: 101 mmol/L (ref 98–110)
Creat: 1.07 mg/dL — ABNORMAL HIGH (ref 0.50–1.05)
GFR, EST AFRICAN AMERICAN: 67 mL/min (ref >=60)
GFR, Est Non African American: 58 mL/min — ABNORMAL LOW (ref >=60)
Glucose, Bld: 159 mg/dL — ABNORMAL HIGH (ref 65–99)
POTASSIUM: 4.5 mmol/L (ref 3.5–5.3)
Sodium: 139 mmol/L (ref 135–146)
Total Bilirubin: 0.3 mg/dL (ref 0.2–1.2)
Total Protein: 7.1 g/dL (ref 6.1–8.1)

## 2017-02-02 LAB — CBC WITH DIFFERENTIAL/PLATELET
Basophils Absolute: 0 cells/uL (ref 0–200)
Basophils Relative: 0 %
Eosinophils Absolute: 192 cells/uL (ref 15–500)
Eosinophils Relative: 2 %
HEMATOCRIT: 39.9 % (ref 35.0–45.0)
Hemoglobin: 12.8 g/dL (ref 11.7–15.5)
LYMPHS PCT: 22 %
Lymphs Abs: 2112 cells/uL (ref 850–3900)
MCH: 28.6 pg (ref 27.0–33.0)
MCHC: 32.1 g/dL (ref 32.0–36.0)
MCV: 89.1 fL (ref 80.0–100.0)
MONO ABS: 384 {cells}/uL (ref 200–950)
MPV: 9.7 fL (ref 7.5–12.5)
Monocytes Relative: 4 %
NEUTROS PCT: 72 %
Neutro Abs: 6912 cells/uL (ref 1500–7800)
Platelets: 262 10*3/uL (ref 140–400)
RBC: 4.48 MIL/uL (ref 3.80–5.10)
RDW: 14.8 % (ref 11.0–15.0)
WBC: 9.6 10*3/uL (ref 3.8–10.8)

## 2017-02-03 LAB — URINALYSIS, MICROSCOPIC ONLY
Bacteria, UA: NONE SEEN [HPF]
CASTS: NONE SEEN [LPF]
Crystals: NONE SEEN [HPF]
RBC / HPF: NONE SEEN RBC/HPF (ref <=2)
WBC UA: NONE SEEN WBC/HPF (ref <=5)
Yeast: NONE SEEN [HPF]

## 2017-02-03 LAB — HEPATITIS B CORE ANTIBODY, IGM: HEP B C IGM: NONREACTIVE

## 2017-02-03 LAB — RHEUMATOID FACTOR

## 2017-02-03 LAB — URINALYSIS, ROUTINE W REFLEX MICROSCOPIC
Bilirubin Urine: NEGATIVE
Glucose, UA: NEGATIVE
Hgb urine dipstick: NEGATIVE
Ketones, ur: NEGATIVE
LEUKOCYTES UA: NEGATIVE
NITRITE: NEGATIVE
SPECIFIC GRAVITY, URINE: 1.012 (ref 1.001–1.035)
pH: 5.5 (ref 5.0–8.0)

## 2017-02-03 LAB — CK: CK TOTAL: 82 U/L (ref 29–143)

## 2017-02-03 LAB — TSH: TSH: 1.64 mIU/L

## 2017-02-03 LAB — HEPATITIS B SURFACE ANTIGEN: Hepatitis B Surface Ag: NEGATIVE

## 2017-02-03 LAB — SEDIMENTATION RATE: Sed Rate: 77 mm/hr — ABNORMAL HIGH (ref 0–30)

## 2017-02-03 LAB — URIC ACID: Uric Acid, Serum: 6.9 mg/dL (ref 2.5–7.0)

## 2017-02-03 LAB — HEPATITIS C ANTIBODY: HCV Ab: NEGATIVE

## 2017-02-05 LAB — QUANTIFERON TB GOLD ASSAY (BLOOD)
Interferon Gamma Release Assay: NEGATIVE
Mitogen-Nil: 9.05 IU/mL
QUANTIFERON NIL VALUE: 0.03 [IU]/mL
Quantiferon Tb Ag Minus Nil Value: 0 IU/mL

## 2017-02-05 LAB — GLUCOSE 6 PHOSPHATE DEHYDROGENASE: G-6PDH: 16.1 U/g Hgb (ref 7.0–20.5)

## 2017-02-05 LAB — 14-3-3 ETA PROTEIN: 14-3-3 eta Protein: <0.2 ng/mL (ref <0.2)

## 2017-02-05 LAB — CYCLIC CITRUL PEPTIDE ANTIBODY, IGG

## 2017-02-05 LAB — ANTI-NUCLEAR AB-TITER (ANA TITER): ANA Titer 1: 1:80 {titer} — ABNORMAL HIGH

## 2017-02-05 LAB — IGG, IGA, IGM
IGA: 475 mg/dL — AB (ref 81–463)
IGG (IMMUNOGLOBIN G), SERUM: 1323 mg/dL (ref 694–1618)
IgM, Serum: 85 mg/dL (ref 48–271)

## 2017-02-05 LAB — ANA: Anti Nuclear Antibody(ANA): POSITIVE — AB

## 2017-02-06 ENCOUNTER — Ambulatory Visit (HOSPITAL_COMMUNITY): Payer: Self-pay | Admitting: Psychiatry

## 2017-02-06 LAB — PROTEIN ELECTROPHORESIS, SERUM, WITH REFLEX
ALPHA-1-GLOBULIN: 0.4 g/dL — AB (ref 0.2–0.3)
ALPHA-2-GLOBULIN: 0.8 g/dL (ref 0.5–0.9)
Albumin ELP: 3.5 g/dL — ABNORMAL LOW (ref 3.8–4.8)
BETA 2: 0.6 g/dL — AB (ref 0.2–0.5)
Beta Globulin: 0.6 g/dL (ref 0.4–0.6)
GAMMA GLOBULIN: 1.2 g/dL (ref 0.8–1.7)
Total Protein, Serum Electrophoresis: 7.1 g/dL (ref 6.1–8.1)

## 2017-02-06 NOTE — Progress Notes (Signed)
Anti-DNA, C3 and C4

## 2017-02-07 ENCOUNTER — Ambulatory Visit (HOSPITAL_COMMUNITY): Payer: Medicare Other | Admitting: Psychiatry

## 2017-02-07 LAB — ANTI-DNA ANTIBODY, DOUBLE-STRANDED: ds DNA Ab: 16 IU/mL — ABNORMAL HIGH

## 2017-02-07 NOTE — Progress Notes (Signed)
Will discuss at follow up visit

## 2017-02-12 ENCOUNTER — Telehealth (INDEPENDENT_AMBULATORY_CARE_PROVIDER_SITE_OTHER): Payer: Self-pay | Admitting: Rheumatology

## 2017-02-12 NOTE — Telephone Encounter (Signed)
02/02/2017 OV NOTE FAXED TO [facility] [phone]

## 2017-02-22 NOTE — Progress Notes (Signed)
Office Visit Note  Patient: Lauren Jenkins             Date of Birth: March 28, 1961           MRN: [id number]             PCP: Seward Carol, MD Referring: No ref. provider found Visit Date: 02/27/2017 Occupation: @GUAROCC @    Subjective:  Pain in multiple joints   History of Present Illness: Lauren Jenkins is a 56 y.o. female with history of osteoarthritis and disc disease. She continues to have pain and discomfort in her joints in her back. She states that she will be going to [place] for having back surgery. She might relocated to [place] and receive her treatment for arthritis there. She intends to go back to Dr. Posey Pronto in [facility]. She would like all her records and labs sent there. She continues to have pain and discomfort in her right shoulder bilateral knee joints and her ankle joints.  Activities of Daily Living:  Patient reports morning stiffness for 2  hours.   Patient Reports nocturnal pain.  Difficulty dressing/grooming: Reports Difficulty climbing stairs: Reports Difficulty getting out of chair: Reports Difficulty using hands for taps, buttons, cutlery, and/or writing: Reports   Review of Systems  Constitutional: Positive for fatigue. Negative for night sweats, weight gain, weight loss and weakness.  HENT: Positive for mouth sores and mouth dryness. Negative for trouble swallowing, trouble swallowing and nose dryness.   Eyes: Positive for dryness. Negative for pain, redness and visual disturbance.  Respiratory: Negative for cough and difficulty breathing.   Cardiovascular: Negative.  Negative for chest pain, palpitations, hypertension, irregular heartbeat and swelling in legs/feet.  Gastrointestinal: Negative.  Negative for blood in stool, constipation and diarrhea.  Endocrine: Negative.  Negative for increased urination.  Genitourinary: Positive for nocturia. Negative for vaginal dryness.  Musculoskeletal: Positive for arthralgias,  joint pain, joint swelling, myalgias, muscle weakness, morning stiffness and myalgias. Negative for muscle tenderness.  Skin: Negative.  Negative for color change, rash, hair loss, skin tightness, ulcers and sensitivity to sunlight.  Allergic/Immunologic: Negative for susceptible to infections.  Neurological: Negative.  Negative for dizziness, headaches, memory loss and night sweats.  Hematological: Negative for swollen glands.  Psychiatric/Behavioral: Positive for depressed mood and sleep disturbance. The patient is nervous/anxious.     PMFS History:  Patient Active Problem List   Diagnosis Date Noted  . Rheumatoid arthritis of multiple sites with negative rheumatoid factor ([place]) 02/01/2017  . Primary osteoarthritis of both knees 02/01/2017  . History of diabetes mellitus 02/01/2017  . History of gastroesophageal reflux (GERD) 02/01/2017  . History of asthma 02/01/2017  . History of hypertension 02/01/2017  . History of chronic pain/ in pain management 02/01/2017  . History of depression and personality disorder/  02/01/2017  . Tobacco abuse 02/01/2017  . DDD (degenerative disc disease), lumbar 02/01/2017  . ANA positive 02/01/2017  . High risk medication use 02/01/2017  . History of cholecystectomy 02/01/2017  . History of anemia 02/01/2017  . Dyslipidemia 02/01/2017  . Morbid obesity ([place]) 10/23/2016  . Obstructive sleep apnea 10/22/2016  . DM2 (diabetes mellitus, type 2) ([place]) 10/22/2016  . Hypertension 10/22/2016  . Acute asthma exacerbation 10/22/2016  . Acute bronchitis 10/22/2016    Past Medical History:  Diagnosis Date  . Asthma   . Asthma   . Cataracts, bilateral   . Chronic anemia   . Chronic pain syndrome 10/22/2016  . CKD (chronic kidney disease)   .  Depression   . DM2 (diabetes mellitus, type 2) ([place])   . Fibromyalgia 10/22/2016  . Hypercholesterolemia   . Hypertension   . Hypertension   . Lupus   . Obstructive sleep apnea   . Personality disorder       Family History  Problem Relation Age of Onset  . Cancer Mother   . Thyroid cancer Mother   . Osteoporosis Mother   . Heart disease Mother   . Multiple sclerosis Sister   . Diabetes Maternal Grandmother   . Osteoporosis Maternal Grandfather   . Diabetes Paternal Grandmother   . Cancer Paternal Grandmother   . Osteoporosis Paternal Grandmother   . Lupus Other    Past Surgical History:  Procedure Laterality Date  . ABDOMINAL HYSTERECTOMY    . BUNIONECTOMY    . CERVICAL FUSION    . GALLBLADDER SURGERY    . KNEE ARTHROSCOPY    . OOPHORECTOMY    . SPINAL FUSION     Social History   Social History Narrative  . No narrative on file     Objective: Vital Signs: BP 124/81   Pulse 82   Resp 16   Ht 5' 3"  (1.6 m)   Wt 249 lb (112.9 kg)   BMI 44.11 kg/m    Physical Exam  Constitutional: She is oriented to person, place, and time. She appears well-developed and well-nourished.  HENT:  Head: Normocephalic and atraumatic.  Eyes: Conjunctivae and EOM are normal.  Neck: Normal range of motion.  Cardiovascular: Normal rate, regular rhythm, normal heart sounds and intact distal pulses.   Pulmonary/Chest: Effort normal and breath sounds normal.  Abdominal: Soft. Bowel sounds are normal.  Lymphadenopathy:    She has no cervical adenopathy.  Neurological: She is alert and oriented to person, place, and time.  Skin: Skin is warm and dry. Capillary refill takes less than 2 seconds.  Psychiatric: She has a normal mood and affect. Her behavior is normal.  Nursing note and vitals reviewed.    Musculoskeletal Exam: C-spine and thoracic spine discomfort with range of motion. Right shoulder joint abduction was 90 painful , left shoulder full range of motion. Elbow joints wrist joints MCPs PIPs DIPs with good range of motion with no synovitis. Hip joints knee joints ankles MTPs PIPs are good range of motion. She has discomfort range of motion of her right knee joint with crepitus without  any warmth swelling or effusion.  CDAI Exam: No CDAI exam completed.    Investigation: Findings:  02/02/2017 ANA positive Nucleolar, Titer 1:80ds DNA +16; IgG 1423, IgA,475 High , IgM 85;Sedimentation rate elevated 77;Serum protein electrophoresis with reflex One or more serum protein fractions are outside the normal ranges. No abnormal protein bands are apparent ;Urinalysis, Routine w reflex microscopic 1+ Protein otherwise normal;Urine Microscopic  Normal;14-3-3 eta Protein Negative;CK Normal ; Cyclic citrul peptide antibody, IgG  Normal ;Glucose 6 phosphate dehydrogenase  Normal;Hepatitis B core antibody, IgM Negative;Hepatitis B surface antigen  Negative;Hepatitis C antibody  Negative;Quantiferon tb gold assay Negative; Rheumatoid factor  Negative; TSH  Normal and Uric acid Normal      Imaging: Xr Foot 2 Views Left  Result Date: 02/02/2017 Left first MTP all PIP/DIP narrowing was noted. None of the other MTPs showed narrowing or erosive changes. No intertarsal joint space narrowing was noted. Small inferior and posterior calcaneal spurs were noted. Impression: These findings were consistent with osteoarthritis  Xr Foot 2 Views Right  Result Date: 02/02/2017 Postsurgical changes noted in the right first  phalanx. None of the other MTPs show any narrowing or erosive changes. PIP/DIP narrowing was noted in all digits. No intertarsal joint space narrowing was noted. Small inferior and posterior calcaneal spurs were noted. Impression: These findings were consistent with osteoarthritis of the foot.  Xr Hand 2 View Left  Result Date: 02/02/2017 Minimal PIP/DIP narrowing was noted. No MCP joint pain or intercarpal joint space narrowing was noted. No erosive changes were noted. Impression: These findings were consistent with mild osteoarthritis  Xr Hand 2 View Right  Result Date: 02/02/2017 Minimal PIP/DIP narrowing was noted. No MCP joint space or intercarpal joint space narrowing was noted. No  erosive changes were noted. Impression: These findings are consistent with osteoarthritis  Xr Knee 3 View Left  Result Date: 02/02/2017 Moderate to severe medial compartment narrowing. Moderate patellofemoral narrowing. Impression: Moderate to severe osteoarthritis and moderate chondromalacia patella  Xr Knee 3 View Right  Result Date: 02/02/2017 Moderate medial compartment narrowing. Moderate chondromalacia patella. No chondrocalcinosis. Impression: Moderate osteoarthritis and moderate chondromalacia patella   Speciality Comments: No specialty comments available.    Procedures:  Large Joint Inj Date/Time: 02/27/2017 9:41 AM Performed by: Bo Merino Authorized by: Bo Merino   Consent Given by:  Patient Site marked: the procedure site was marked   Timeout: prior to procedure the correct patient, procedure, and site was verified   Indications:  Pain Location:  Knee Site:  R knee Prep: patient was prepped and draped in usual sterile fashion   Needle Size:  27 G Needle Length:  1.5 inches Approach:  Medial Ultrasound Guidance: No   Fluoroscopic Guidance: No   Arthrogram: No   Medications:  1.5 mL lidocaine 1 %; 40 mg triamcinolone acetonide 40 MG/ML Aspiration Attempted: Yes   Patient tolerance:  Patient tolerated the procedure well with no immediate complications   Allergies: Codeine sulfate; Ancef [cefazolin]; Apap [acetaminophen]; Elavil [amitriptyline]; Haldol [haloperidol]; Lyrica [pregabalin]; Prednisone; Procardia [nifedipine]; Rocephin [ceftriaxone]; Clindamycin/lincomycin; Lipitor [atorvastatin]; Metoprolol tartrate; and Zithromax [azithromycin]   Assessment / Plan:     Visit Diagnoses: Rheumatoid arthritis of multiple sites with negative rheumatoid factor (HCC) - RF<10,CCP Ab -, 14-3-3n-,  ESR 66, ANA 1:80 NO, dxd with RA in [place]. Ttd with Plaquenil, methotrexate, leflunomide, Humira in the past.No Tt in 1 yr. patient states all the treatments  given in the past did not benefit her. Patient has no synovitis on examination today. Her most recent wraps were unremarkable except for elevated sedimentation rate. Which is been persistently elevated for a long time.   ANA positive - 1:80 nucleolar pattern, dsDNA 16 positive, ESR 77. My plan was to obtain [facility] lab and ultrasound bilateral hands. The patient is not interested at this time. She states she's relocating to [place] and we'll see her previous rheumatologist.  Primary osteoarthritis of both hands: She has some chronic discomfort  Chronic pain of right knee: Per her request after informed consent was obtained right knee joint was prepped and strong fashion and injected with cortisone the procedures described above. She had no warmth swelling or effusion in the knee joint.  Primary osteoarthritis of both knees - Bilateral moderate to severe osteoarthritis and chondromalacia patella  Primary osteoarthritis of both feet: Chronic pain  DDD (degenerative disc disease), lumbar -  status post fusion: She is having increased pain in the big going back to [place] for surgery  History of chronic pain/ in pain management  Morbid obesity ([place]): Weight loss diet and exercise was discussed.  History  of hypertension: Her blood pressure was fairly well controlled.  History of diabetes mellitus: Have advised to monitor blood sugars closely after the cortisone injection.  History of gastroesophageal reflux (GERD)  History of asthma  Tobacco abuse  History of depression and personality disorder/   History of anemia  History of cholecystectomy    Orders: Orders Placed This Encounter  Procedures  . Large Joint Injection/Arthrocentesis   No orders of the defined types were placed in this encounter.   Face-to-face time spent with patient was 30 minutes. 50% of time was spent in counseling and coordination of care.  Follow-Up Instructions: Return if symptoms worsen or  fail to improve, for Osteoarthritis.   Bo Merino, MD  Note - This record has been created using Editor, commissioning.  Chart creation errors have been sought, but may not always  have been located. Such creation errors do not reflect on  the standard of medical care.

## 2017-02-27 ENCOUNTER — Ambulatory Visit (INDEPENDENT_AMBULATORY_CARE_PROVIDER_SITE_OTHER): Payer: Medicare Other | Admitting: Rheumatology

## 2017-02-27 ENCOUNTER — Encounter: Payer: Self-pay | Admitting: Rheumatology

## 2017-02-27 VITALS — BP 124/81 | HR 82 | Resp 16 | Ht 63.0 in | Wt 249.0 lb

## 2017-02-27 DIAGNOSIS — Z8679 Personal history of other diseases of the circulatory system: Secondary | ICD-10-CM

## 2017-02-27 DIAGNOSIS — M5136 Other intervertebral disc degeneration, lumbar region: Secondary | ICD-10-CM | POA: Diagnosis not present

## 2017-02-27 DIAGNOSIS — M19071 Primary osteoarthritis, right ankle and foot: Secondary | ICD-10-CM | POA: Diagnosis not present

## 2017-02-27 DIAGNOSIS — M170 Bilateral primary osteoarthritis of knee: Secondary | ICD-10-CM

## 2017-02-27 DIAGNOSIS — Z720 Tobacco use: Secondary | ICD-10-CM

## 2017-02-27 DIAGNOSIS — Z9049 Acquired absence of other specified parts of digestive tract: Secondary | ICD-10-CM

## 2017-02-27 DIAGNOSIS — G8929 Other chronic pain: Secondary | ICD-10-CM | POA: Diagnosis not present

## 2017-02-27 DIAGNOSIS — Z87898 Personal history of other specified conditions: Secondary | ICD-10-CM | POA: Diagnosis not present

## 2017-02-27 DIAGNOSIS — R768 Other specified abnormal immunological findings in serum: Secondary | ICD-10-CM

## 2017-02-27 DIAGNOSIS — M19041 Primary osteoarthritis, right hand: Secondary | ICD-10-CM | POA: Diagnosis not present

## 2017-02-27 DIAGNOSIS — Z79899 Other long term (current) drug therapy: Secondary | ICD-10-CM | POA: Diagnosis not present

## 2017-02-27 DIAGNOSIS — M25561 Pain in right knee: Secondary | ICD-10-CM

## 2017-02-27 DIAGNOSIS — M0609 Rheumatoid arthritis without rheumatoid factor, multiple sites: Secondary | ICD-10-CM

## 2017-02-27 DIAGNOSIS — M19042 Primary osteoarthritis, left hand: Secondary | ICD-10-CM

## 2017-02-27 DIAGNOSIS — Z8639 Personal history of other endocrine, nutritional and metabolic disease: Secondary | ICD-10-CM

## 2017-02-27 DIAGNOSIS — Z8659 Personal history of other mental and behavioral disorders: Secondary | ICD-10-CM

## 2017-02-27 DIAGNOSIS — Z8709 Personal history of other diseases of the respiratory system: Secondary | ICD-10-CM

## 2017-02-27 DIAGNOSIS — Z8719 Personal history of other diseases of the digestive system: Secondary | ICD-10-CM

## 2017-02-27 DIAGNOSIS — Z862 Personal history of diseases of the blood and blood-forming organs and certain disorders involving the immune mechanism: Secondary | ICD-10-CM

## 2017-02-27 DIAGNOSIS — M19072 Primary osteoarthritis, left ankle and foot: Secondary | ICD-10-CM

## 2017-02-27 MED ORDER — TRIAMCINOLONE ACETONIDE 40 MG/ML IJ SUSP
40.0000 mg | INTRAMUSCULAR | Status: AC | PRN
  Administered 2017-02-27: 40 mg via INTRA_ARTICULAR

## 2017-02-27 MED ORDER — LIDOCAINE HCL 1 % IJ SOLN
1.5000 mL | INTRAMUSCULAR | Status: AC | PRN
  Administered 2017-02-27: 1.5 mL

## 2017-03-02 ENCOUNTER — Telehealth (INDEPENDENT_AMBULATORY_CARE_PROVIDER_SITE_OTHER): Payer: Self-pay | Admitting: Rheumatology

## 2017-03-02 NOTE — Telephone Encounter (Signed)
Medical records release form received from patient. Form was incomplete, mailed back to patient to complete highlighted areas and return to office.

## 2017-03-13 ENCOUNTER — Ambulatory Visit (HOSPITAL_COMMUNITY): Payer: Self-pay | Admitting: Psychiatry

## 2017-03-20 ENCOUNTER — Ambulatory Visit (HOSPITAL_COMMUNITY): Payer: Medicare Other | Admitting: Psychiatry

## 2017-04-09 ENCOUNTER — Telehealth (INDEPENDENT_AMBULATORY_CARE_PROVIDER_SITE_OTHER): Payer: Self-pay | Admitting: Rheumatology

## 2017-07-06 IMAGING — CR DG CHEST 2V
2 series · 2 of 2 positions shown · non-contrast
Comparison: None.

CLINICAL DATA: Cough, chest pain.

EXAM:
CHEST  2 VIEW

[w chest lat]
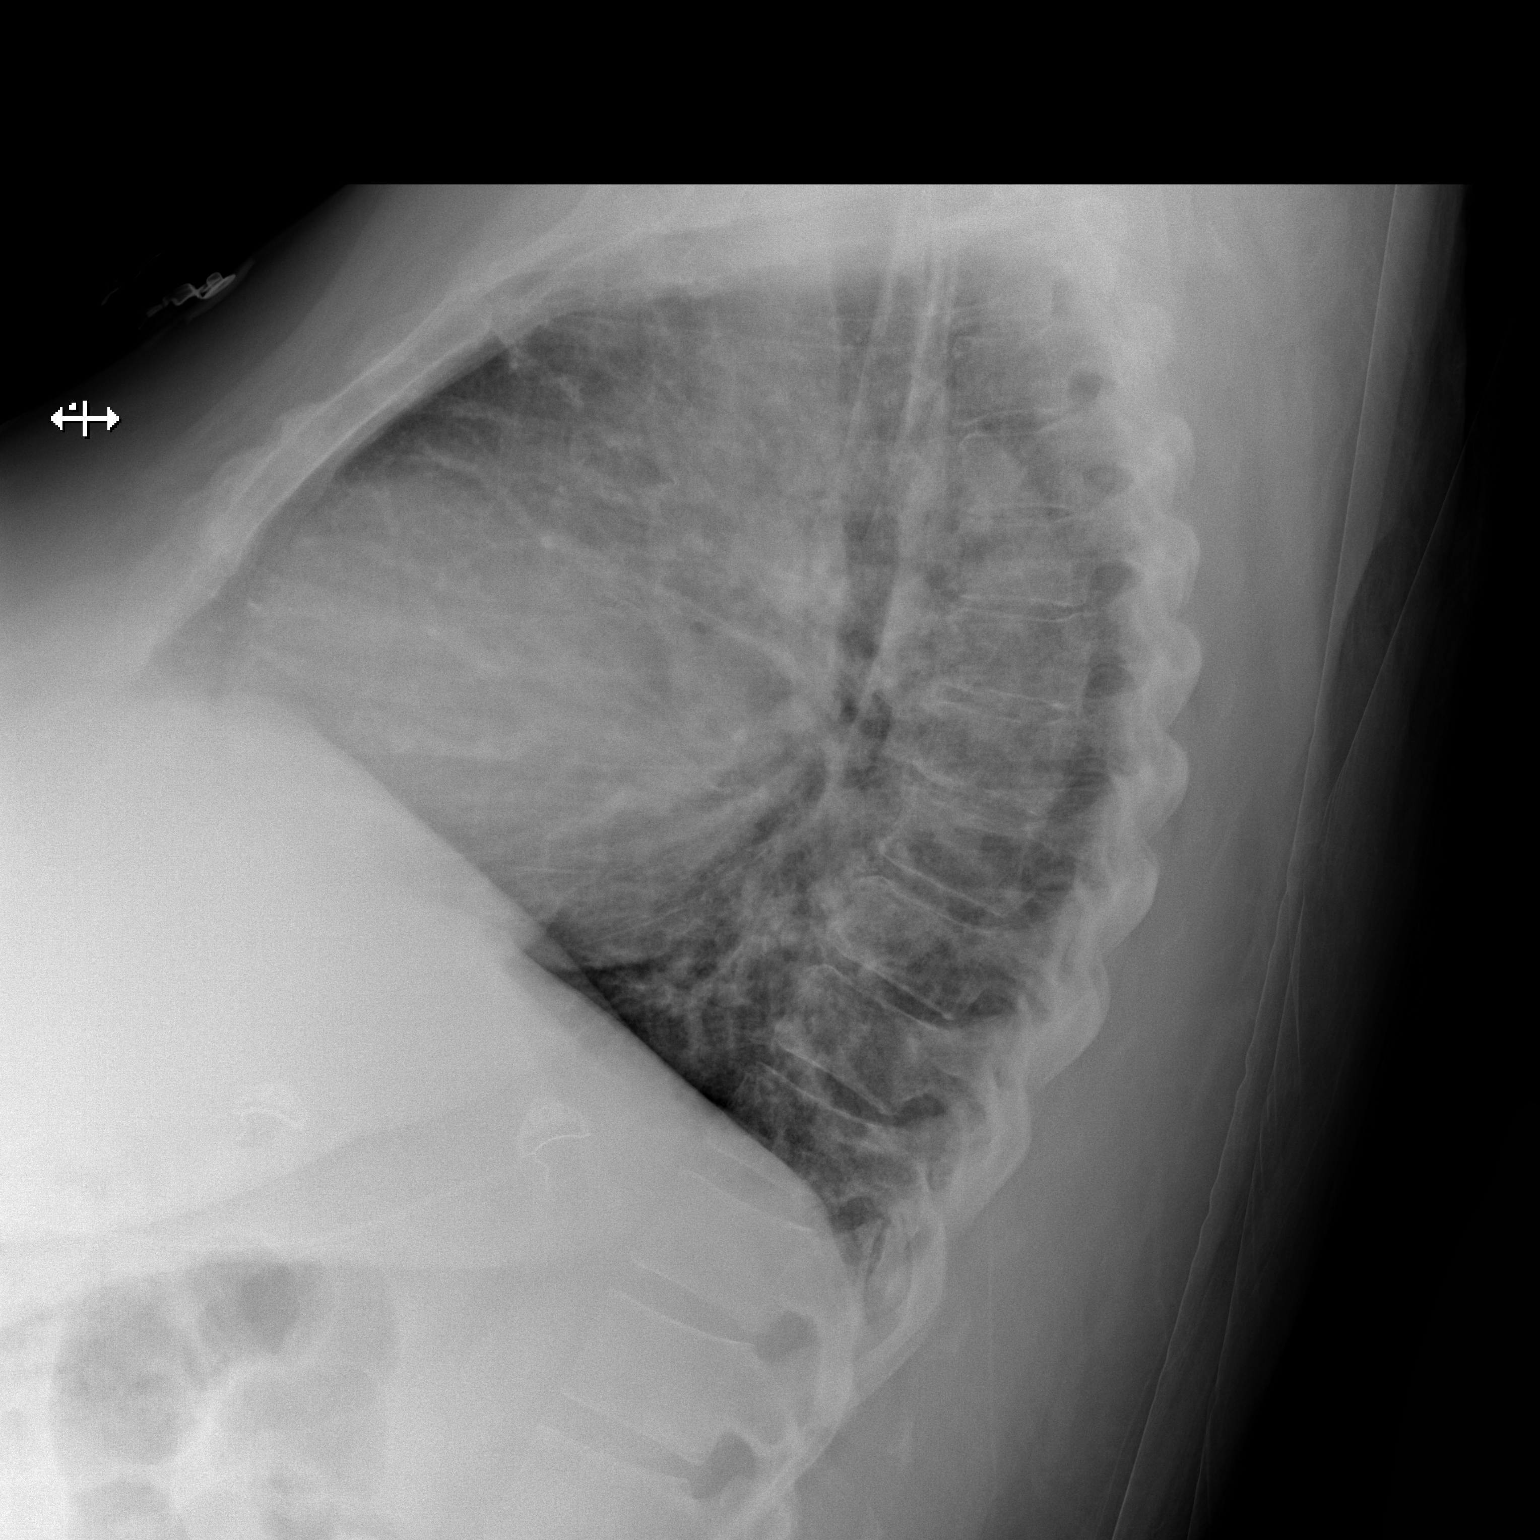

[x chest ap]
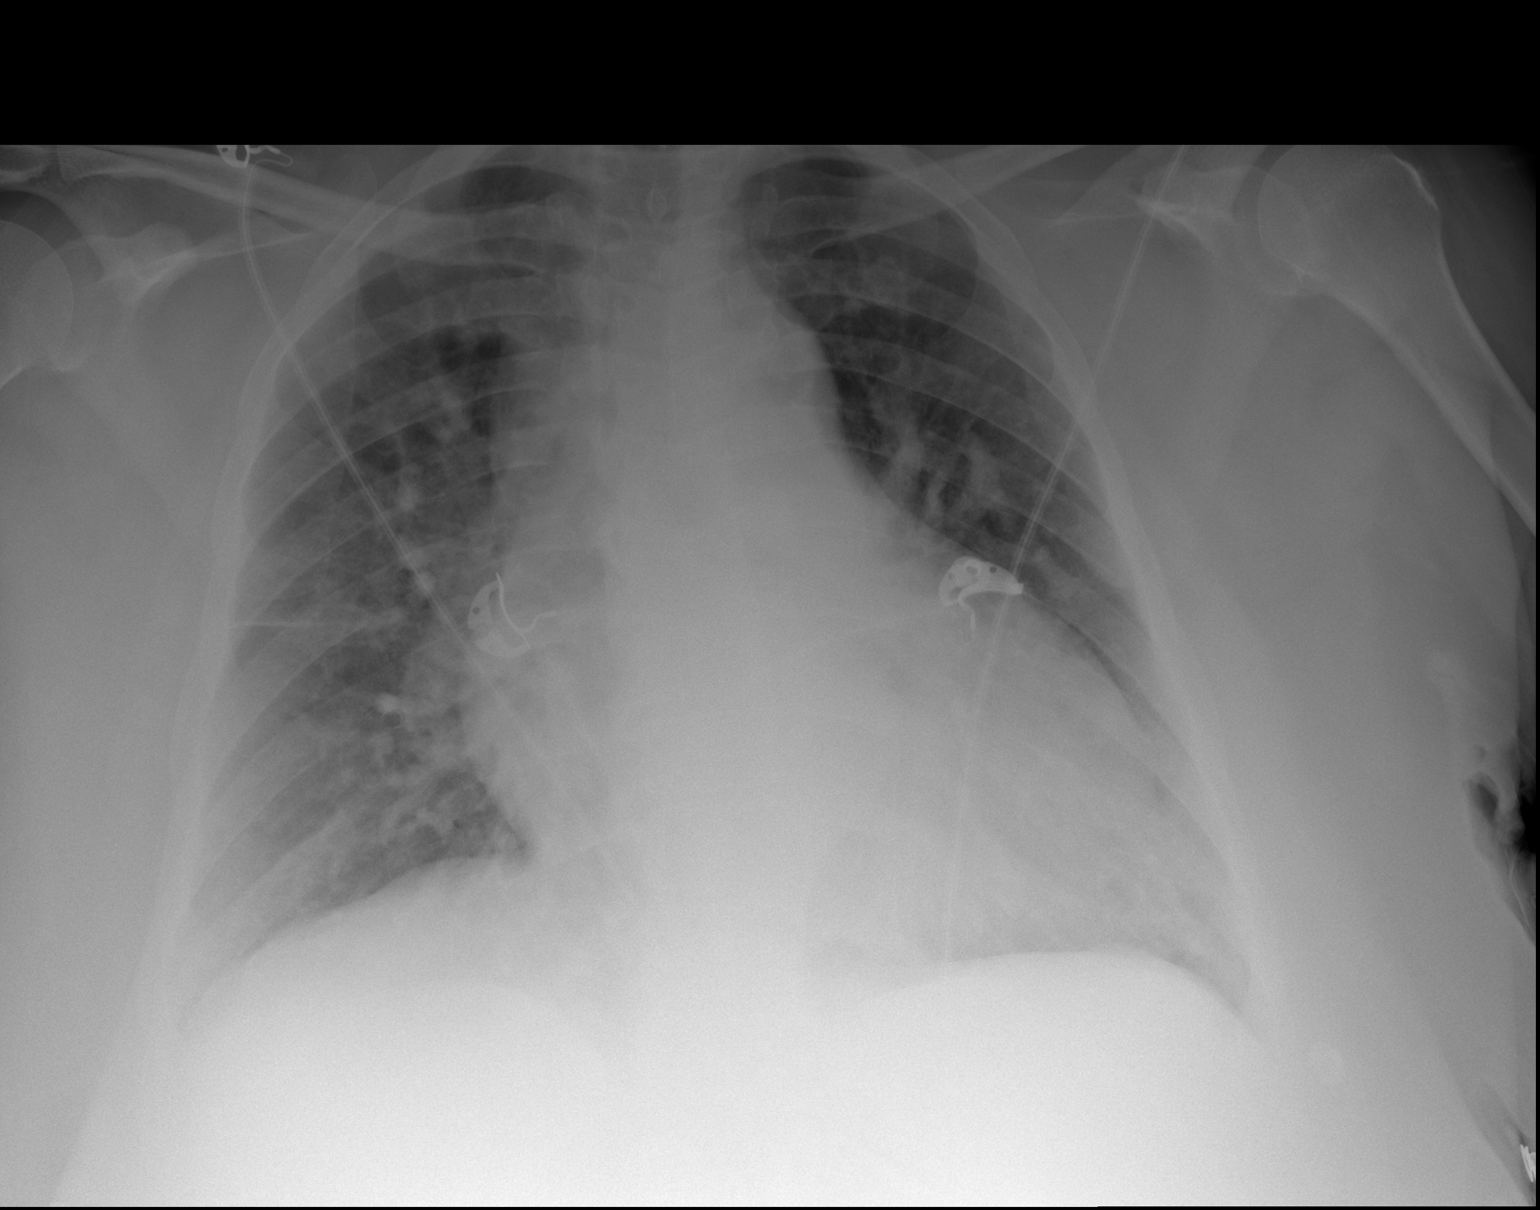

[2 of 2 positions shown; findings below may reference images not displayed]

FINDINGS: Mild cardiomegaly is noted with central pulmonary vascular
congestion. No pneumothorax or pleural effusion is noted. No
consolidative process is noted in the lungs. Bony thorax is
unremarkable.
IMPRESSION: Mild cardiomegaly with central pulmonary vascular congestion.

## 2017-09-13 ENCOUNTER — Telehealth (INDEPENDENT_AMBULATORY_CARE_PROVIDER_SITE_OTHER): Payer: Self-pay | Admitting: Rheumatology

## 2017-09-13 NOTE — Telephone Encounter (Signed)
Records faxed to Dr. Eliane DecreePatel's office [phone], callback [phone]

## 2021-06-21 DEATH — deceased
